# Patient Record
Sex: Male | Born: 1980 | Race: Black or African American | Hispanic: No | Marital: Single | Smoking: Current every day smoker
Health system: Southern US, Community
[De-identification: ages and names within clinical notes are randomized; demographics above are authoritative.]

## PROBLEM LIST (undated history)

## (undated) DIAGNOSIS — G43909 Migraine, unspecified, not intractable, without status migrainosus: Secondary | ICD-10-CM

## (undated) DIAGNOSIS — J45909 Unspecified asthma, uncomplicated: Secondary | ICD-10-CM

## (undated) DIAGNOSIS — F1010 Alcohol abuse, uncomplicated: Secondary | ICD-10-CM

## (undated) DIAGNOSIS — S82899A Other fracture of unspecified lower leg, initial encounter for closed fracture: Secondary | ICD-10-CM

## (undated) HISTORY — PX: NO PAST SURGERIES: SHX2092

---

## 2010-12-24 ENCOUNTER — Emergency Department (HOSPITAL_COMMUNITY): Payer: Self-pay

## 2010-12-24 ENCOUNTER — Emergency Department (HOSPITAL_COMMUNITY)
Admission: EM | Admit: 2010-12-24 | Discharge: 2010-12-24 | Disposition: A | Discharge status: Home / Self Care | Payer: Self-pay | Attending: Emergency Medicine | Admitting: Emergency Medicine

## 2010-12-24 DIAGNOSIS — S62339A Displaced fracture of neck of unspecified metacarpal bone, initial encounter for closed fracture: Secondary | ICD-10-CM | POA: Insufficient documentation

## 2010-12-24 DIAGNOSIS — M79609 Pain in unspecified limb: Secondary | ICD-10-CM | POA: Insufficient documentation

## 2011-08-28 ENCOUNTER — Emergency Department: Payer: Self-pay | Admitting: Emergency Medicine

## 2012-07-30 ENCOUNTER — Emergency Department (HOSPITAL_COMMUNITY)
Admission: EM | Admit: 2012-07-30 | Discharge: 2012-07-30 | Disposition: A | Discharge status: Home / Self Care | Payer: Self-pay | Attending: Emergency Medicine | Admitting: Emergency Medicine

## 2012-07-30 ENCOUNTER — Emergency Department (HOSPITAL_COMMUNITY): Payer: Self-pay

## 2012-07-30 ENCOUNTER — Encounter (HOSPITAL_COMMUNITY): Payer: Self-pay | Admitting: *Deleted

## 2012-07-30 DIAGNOSIS — F17200 Nicotine dependence, unspecified, uncomplicated: Secondary | ICD-10-CM | POA: Insufficient documentation

## 2012-07-30 DIAGNOSIS — S058X9A Other injuries of unspecified eye and orbit, initial encounter: Secondary | ICD-10-CM | POA: Insufficient documentation

## 2012-07-30 DIAGNOSIS — S01119A Laceration without foreign body of unspecified eyelid and periocular area, initial encounter: Secondary | ICD-10-CM

## 2012-07-30 MED ORDER — BACITRACIN 500 UNIT/GM EX OINT: 1.0000 "application " | TOPICAL_OINTMENT | Freq: Four times a day (QID) | CUTANEOUS | Status: DC

## 2012-07-30 MED ORDER — OXYCODONE-ACETAMINOPHEN 5-325 MG PO TABS
2.0000 | ORAL_TABLET | Freq: Once | ORAL | Status: AC
  Administered 2012-07-30: 2 via ORAL
  Filled 2012-07-30: qty 2

## 2012-07-30 MED ORDER — FENTANYL CITRATE 0.05 MG/ML IJ SOLN: 50.0000 ug | Freq: Once | INTRAMUSCULAR | Status: DC

## 2012-07-30 MED ORDER — OXYCODONE-ACETAMINOPHEN 5-325 MG PO TABS: 1.0000 | ORAL_TABLET | Freq: Four times a day (QID) | ORAL | Status: DC | PRN
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
## 2012-07-30 NOTE — ED Provider Notes (Signed)
  Physical Exam  BP 129/84  Pulse 89  Temp 97.6 F (36.4 C) (Oral)  Resp 16  Ht 6' (1.829 m)  Wt 194 lb (87.998 kg)  BMI 26.31 kg/m2  SpO2 97%  Physical Exam Dr. Allyne Gee at bedside suturing lid  ED Course  Procedures  MDM       Arman Filter, NP 08/01/12 [phone]

## 2012-08-04 NOTE — ED Provider Notes (Signed)
Medical screening examination/treatment/procedure(s) were conducted as a shared visit with non-physician practitioner(s) and myself.  I personally evaluated the patient during the encounter  Francisco Jacobs. Rubin Payor, MD 08/04/12 2300

## 2012-12-14 ENCOUNTER — Encounter (HOSPITAL_COMMUNITY): Payer: Self-pay | Admitting: *Deleted

## 2012-12-14 ENCOUNTER — Emergency Department (HOSPITAL_COMMUNITY)
Admission: EM | Admit: 2012-12-14 | Discharge: 2012-12-14 | Disposition: A | Discharge status: Home / Self Care | Payer: Self-pay | Attending: Emergency Medicine | Admitting: Emergency Medicine

## 2012-12-14 DIAGNOSIS — F17200 Nicotine dependence, unspecified, uncomplicated: Secondary | ICD-10-CM | POA: Insufficient documentation

## 2012-12-14 DIAGNOSIS — Y929 Unspecified place or not applicable: Secondary | ICD-10-CM | POA: Insufficient documentation

## 2012-12-14 DIAGNOSIS — Y9389 Activity, other specified: Secondary | ICD-10-CM | POA: Insufficient documentation

## 2012-12-14 DIAGNOSIS — S61011A Laceration without foreign body of right thumb without damage to nail, initial encounter: Secondary | ICD-10-CM

## 2012-12-14 DIAGNOSIS — W269XXA Contact with unspecified sharp object(s), initial encounter: Secondary | ICD-10-CM | POA: Insufficient documentation

## 2012-12-14 DIAGNOSIS — S61209A Unspecified open wound of unspecified finger without damage to nail, initial encounter: Secondary | ICD-10-CM | POA: Insufficient documentation
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
## 2012-12-14 NOTE — ED Notes (Signed)
Pt d/c with [employer]
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
## 2012-12-15 NOTE — ED Provider Notes (Signed)
  Medical screening examination/treatment/procedure(s) were performed by non-physician practitioner and as supervising physician I was immediately available for consultation/collaboration.    Gerhard Munch, MD 12/15/12 2326

## 2013-06-30 ENCOUNTER — Encounter (HOSPITAL_COMMUNITY): Payer: Self-pay | Admitting: Emergency Medicine

## 2013-06-30 ENCOUNTER — Emergency Department (HOSPITAL_COMMUNITY)
Admission: EM | Admit: 2013-06-30 | Discharge: 2013-06-30 | Disposition: A | Discharge status: Home / Self Care | Payer: Self-pay | Attending: Emergency Medicine | Admitting: Emergency Medicine

## 2013-06-30 DIAGNOSIS — S01501A Unspecified open wound of lip, initial encounter: Secondary | ICD-10-CM | POA: Insufficient documentation

## 2013-06-30 DIAGNOSIS — F17200 Nicotine dependence, unspecified, uncomplicated: Secondary | ICD-10-CM | POA: Insufficient documentation

## 2013-06-30 DIAGNOSIS — Y939 Activity, unspecified: Secondary | ICD-10-CM | POA: Insufficient documentation

## 2013-06-30 DIAGNOSIS — Y9289 Other specified places as the place of occurrence of the external cause: Secondary | ICD-10-CM | POA: Insufficient documentation

## 2013-06-30 DIAGNOSIS — W010XXA Fall on same level from slipping, tripping and stumbling without subsequent striking against object, initial encounter: Secondary | ICD-10-CM | POA: Insufficient documentation

## 2013-06-30 DIAGNOSIS — IMO0002 Reserved for concepts with insufficient information to code with codable children: Secondary | ICD-10-CM

## 2013-06-30 MED ORDER — CEPHALEXIN 500 MG PO CAPS: 500.0000 mg | ORAL_CAPSULE | Freq: Four times a day (QID) | ORAL | Status: DC

## 2013-06-30 MED ORDER — HYDROCODONE-ACETAMINOPHEN 5-325 MG PO TABS: 1.0000 | ORAL_TABLET | ORAL | Status: DC | PRN
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
## 2014-01-07 ENCOUNTER — Observation Stay (HOSPITAL_COMMUNITY)
Admission: AD | Admit: 2014-01-07 | Discharge: 2014-01-08 | Disposition: A | Discharge status: Home / Self Care | Payer: PRIVATE HEALTH INSURANCE | Source: Intra-hospital | Attending: Psychiatry | Admitting: Psychiatry

## 2014-01-07 ENCOUNTER — Encounter (HOSPITAL_COMMUNITY): Payer: Self-pay | Admitting: Emergency Medicine

## 2014-01-07 ENCOUNTER — Emergency Department (HOSPITAL_COMMUNITY)
Admission: EM | Admit: 2014-01-07 | Discharge: 2014-01-07 | Disposition: A | Discharge status: Other Acute Inpatient Hospital | Payer: Self-pay | Attending: Emergency Medicine | Admitting: Emergency Medicine

## 2014-01-07 DIAGNOSIS — Z79899 Other long term (current) drug therapy: Secondary | ICD-10-CM | POA: Insufficient documentation

## 2014-01-07 DIAGNOSIS — F332 Major depressive disorder, recurrent severe without psychotic features: Secondary | ICD-10-CM | POA: Insufficient documentation

## 2014-01-07 DIAGNOSIS — F1994 Other psychoactive substance use, unspecified with psychoactive substance-induced mood disorder: Secondary | ICD-10-CM | POA: Insufficient documentation

## 2014-01-07 DIAGNOSIS — F17200 Nicotine dependence, unspecified, uncomplicated: Secondary | ICD-10-CM | POA: Insufficient documentation

## 2014-01-07 DIAGNOSIS — R45850 Homicidal ideations: Secondary | ICD-10-CM | POA: Insufficient documentation

## 2014-01-07 DIAGNOSIS — F1010 Alcohol abuse, uncomplicated: Principal | ICD-10-CM | POA: Insufficient documentation

## 2014-01-07 DIAGNOSIS — F10929 Alcohol use, unspecified with intoxication, unspecified: Secondary | ICD-10-CM

## 2014-01-07 DIAGNOSIS — F1020 Alcohol dependence, uncomplicated: Secondary | ICD-10-CM | POA: Diagnosis present

## 2014-01-07 LAB — COMPREHENSIVE METABOLIC PANEL
ALK PHOS: 86 U/L (ref 39–117)
ALT: 180 U/L — AB (ref 0–53)
AST: 182 U/L — ABNORMAL HIGH (ref 0–37)
Albumin: 3.9 g/dL (ref 3.5–5.2)
BILIRUBIN TOTAL: 0.2 mg/dL — AB (ref 0.3–1.2)
BUN: 7 mg/dL (ref 6–23)
CHLORIDE: 103 meq/L (ref 96–112)
CO2: 21 meq/L (ref 19–32)
Calcium: 9.5 mg/dL (ref 8.4–10.5)
Creatinine, Ser: 0.66 mg/dL (ref 0.50–1.35)
GFR calc non Af Amer: >90 mL/min (ref >90)
GLUCOSE: 98 mg/dL (ref 70–99)
POTASSIUM: 4 meq/L (ref 3.7–5.3)
SODIUM: 141 meq/L (ref 137–147)
TOTAL PROTEIN: 8.2 g/dL (ref 6.0–8.3)

## 2014-01-07 LAB — ACETAMINOPHEN LEVEL

## 2014-01-07 LAB — RAPID URINE DRUG SCREEN, HOSP PERFORMED
Amphetamines: NOT DETECTED
BARBITURATES: NOT DETECTED
Benzodiazepines: NOT DETECTED
COCAINE: NOT DETECTED
Opiates: NOT DETECTED
TETRAHYDROCANNABINOL: NOT DETECTED

## 2014-01-07 LAB — CBC
HEMATOCRIT: 41.1 % (ref 39.0–52.0)
HEMOGLOBIN: 14.1 g/dL (ref 13.0–17.0)
MCH: 33.1 pg (ref 26.0–34.0)
MCHC: 34.3 g/dL (ref 30.0–36.0)
MCV: 96.5 fL (ref 78.0–100.0)
Platelets: 177 10*3/uL (ref 150–400)
RBC: 4.26 MIL/uL (ref 4.22–5.81)
RDW: 13.1 % (ref 11.5–15.5)
WBC: 4.5 10*3/uL (ref 4.0–10.5)

## 2014-01-07 LAB — SALICYLATE LEVEL: Salicylate Lvl: <2 mg/dL — ABNORMAL LOW (ref 2.8–20.0)

## 2014-01-07 LAB — ETHANOL: Alcohol, Ethyl (B): 163 mg/dL — ABNORMAL HIGH (ref 0–11)

## 2014-01-07 MED ORDER — LORAZEPAM 2 MG/ML IJ SOLN: 0.0000 mg | Freq: Four times a day (QID) | INTRAMUSCULAR | Status: DC

## 2014-01-07 MED ORDER — THIAMINE HCL 100 MG/ML IJ SOLN: 100.0000 mg | Freq: Every day | INTRAMUSCULAR | Status: DC

## 2014-01-07 MED ORDER — LORAZEPAM 1 MG PO TABS
0.0000 mg | ORAL_TABLET | Freq: Four times a day (QID) | ORAL | Status: DC
Start: 2014-01-07 — End: 2014-01-07

## 2014-01-07 MED ORDER — LORAZEPAM 1 MG PO TABS: 0.0000 mg | ORAL_TABLET | Freq: Two times a day (BID) | ORAL | Status: DC

## 2014-01-07 MED ORDER — LORAZEPAM 2 MG/ML IJ SOLN: 0.0000 mg | Freq: Two times a day (BID) | INTRAMUSCULAR | Status: DC

## 2014-01-07 MED ORDER — ACETAMINOPHEN 325 MG PO TABS
650.0000 mg | ORAL_TABLET | Freq: Once | ORAL | Status: AC
  Administered 2014-01-07: 650 mg via ORAL
  Filled 2014-01-07: qty 2

## 2014-01-07 MED ORDER — VITAMIN B-1 100 MG PO TABS
100.0000 mg | ORAL_TABLET | Freq: Every day | ORAL | Status: DC
  Administered 2014-01-07: 100 mg via ORAL
  Filled 2014-01-07: qty 1

## 2014-01-07 NOTE — ED Provider Notes (Signed)
Medical screening examination/treatment/procedure(s) were performed by non-physician practitioner and as supervising physician I was immediately available for consultation/collaboration.   EKG Interpretation None       Duayne Brideau K Linker, MD 01/07/14 2038 

## 2014-01-07 NOTE — ED Notes (Signed)
[facility] called.

## 2014-01-07 NOTE — ED Notes (Signed)
Patient speaking with [facility]

## 2014-01-07 NOTE — ED Provider Notes (Signed)
CSN: [id number]     Arrival date & time 01/07/14  1813 History   First MD Initiated Contact with Patient 01/07/14 1826     Chief Complaint  Patient presents with  . Alcohol Problem  . Homicidal     (Consider location/radiation/quality/duration/timing/severity/associated sxs/prior Treatment) HPI Comments: Patient is a 33 year old otherwise healthy male who presents today for detox from alcohol. He states that he has been drinking heavily over the past year. He drinks approximately 18-24 beers daily. He is periods of blackout due to alcohol. He has never had any seizure from alcohol withdrawal. He states he drank approximately 12 beers today and his last drink was around 4 PM. He denies any other drug use. He gives a history of remote cocaine and marijuana use. He states that his parole officer encouraged him to come to the emergency department for detox. He states that he thinks about hurting the people around him. He states "I get violent when I'm drunk". He states "I don't know what I would do". He denies any suicidal ideations. No visual or auditory hallucinations.  The history is provided by the patient. No language interpreter was used.    History reviewed. No pertinent past medical history. History reviewed. No pertinent past surgical history. No family history on file. History  Substance Use Topics  . Smoking status: Current Every Day Smoker -- 0.50 packs/day    Types: Cigarettes  . Smokeless tobacco: Current User  . Alcohol Use: Yes     Comment: reports heavy drinking x year    Review of Systems  Constitutional: Negative for fever and chills.  Respiratory: Negative for shortness of breath.   Cardiovascular: Negative for chest pain.  Gastrointestinal: Negative for nausea, vomiting and abdominal pain.  Psychiatric/Behavioral: Positive for behavioral problems, dysphoric mood and agitation. Negative for suicidal ideas and hallucinations.  All other systems reviewed and are  negative.     Allergies  Review of patient's allergies indicates no known allergies.  Home Medications   Prior to Admission medications   Medication Sig Start Date End Date Taking? Authorizing Provider  cephALEXin (KEFLEX) 500 MG capsule Take 1 capsule (500 mg total) by mouth 4 (four) times daily. 06/30/13   Kristen N Ward, DO  HYDROcodone-acetaminophen (NORCO/VICODIN) 5-325 MG per tablet Take 1 tablet by mouth every 4 (four) hours as needed. 06/30/13   Kristen N Ward, DO   BP 157/78  Pulse 96  Temp(Src) 97.9 F (36.6 C) (Oral)  Resp 18  Ht 6' (1.829 m)  Wt 215 lb (97.523 kg)  BMI 29.15 kg/m2  SpO2 97% Physical Exam  Nursing note and vitals reviewed. Constitutional: He is oriented to person, place, and time. He appears well-developed and well-nourished. No distress.  HENT:  Head: Normocephalic and atraumatic.  Right Ear: External ear normal.  Left Ear: External ear normal.  Nose: Nose normal.  Eyes: Conjunctivae are normal.  Neck: Normal range of motion. No tracheal deviation present.  Cardiovascular: Normal rate, regular rhythm and normal heart sounds.   Pulmonary/Chest: Effort normal and breath sounds normal. No stridor.  Abdominal: Soft. He exhibits no distension. There is no tenderness.  Musculoskeletal: Normal range of motion.  Neurological: He is alert and oriented to person, place, and time.  Skin: Skin is warm and dry. He is not diaphoretic.  Psychiatric: He has a normal mood and affect. His behavior is normal. He expresses impulsivity. He expresses homicidal (vague) ideation. He expresses no suicidal ideation. He expresses no suicidal plans and no homicidal  plans.    ED Course  Procedures (including critical care time) Labs Review Labs Reviewed  COMPREHENSIVE METABOLIC PANEL - Abnormal; Notable for the following:    AST 182 (*)    ALT 180 (*)    Total Bilirubin 0.2 (*)    All other components within normal limits  ETHANOL - Abnormal; Notable for the  following:    Alcohol, Ethyl (B) 163 (*)    All other components within normal limits  SALICYLATE LEVEL - Abnormal; Notable for the following:    Salicylate Lvl <2.0 (*)    All other components within normal limits  ACETAMINOPHEN LEVEL  CBC  URINE RAPID DRUG SCREEN (HOSP PERFORMED)    Imaging Review No results found.   EKG Interpretation None      MDM   Final diagnoses:  Alcohol intoxication  Alcohol abuse    Patient presents to the emergency department with alcohol intoxication and is requesting detox from alcohol. 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 He has a vague homicidal ideations. He states that when he gets strong she gets violent and cannot control himself. Patient is currently asymptomatic. Patient is medically cleared. Patient is currently undergoing TTS evaluation.   Mora BellmanHannah S Whitnee Orzel, PA-C 01/07/14 2035

## 2014-01-07 NOTE — ED Provider Notes (Signed)
Medical screening examination/treatment/procedure(s) were performed by non-physician practitioner and as supervising physician I was immediately available for consultation/collaboration.   EKG Interpretation None       Ethelda Chick, MD 01/07/14 2100

## 2014-01-07 NOTE — BH Assessment (Signed)
Clinician consulted with Alberteen Sam NP who states Pt meets criteria for inpatient admission to Observation unit until a bed becomes available on the unit. Clint Bolder, [facility] reported availability on Observation unit at this time. Clinician provided updates to Claris Gladden, NP.    Yaakov Guthrie, MSW, LCSW Triage Specialist [phone]

## 2014-01-07 NOTE — ED Notes (Signed)
Pt here for detox from ETOH and cocaine. States he hasn't used cocaine in about a week. Reports drinking a 12 pack of beer today. States he has been drinking heavily for appx 1 year. Pt also expressing homicidal thoughts stating "I just think about hurting everyone around me." When asked if he wants to hurt himself pt states "I don't know, I am just numb." Pt is calm, cooperative. Denies auditory/visual hallucinations.

## 2014-01-07 NOTE — BH Assessment (Signed)
Tele Assessment Note   Francisco MediciMarcus Jacobs is an 33 y.o. male who [facility] alcohol detox. Pt reported his probation officer suggested that he seek alcohol detox.  Pt reported drinking alcohol daily for the past year. Pt reported hx of cocaine use but stated last use was 7-8 months ago. Pt reported that once he stopped using cocaine, his alcohol use increased greatly. Pt denied experiencing withdrawal symptoms but stated he was irritable and had a headache. Pt denies inpatient SA hx. Pt stated he is currently involved in [facility] program for the last 3 months for substance use.   Pt denies SI, HI, and AVH. Pt Ox4. Pt reported anxious and irritable mood with anxious affect.  Pt reported he is currently homeless and his family supports live in [place]. 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 Pt indicated his current stressors are due to him dealing with 2 different women that he got pregnant, which increases his urge to drink.  Axis I: Alcohol Use Disorder, severe Axis II: Deferred Axis III: History reviewed. No pertinent past medical history. Axis IV: housing problems, other psychosocial or environmental problems and problems related to legal system/crime  Past Medical History: History reviewed. No pertinent past medical history.  History reviewed. No pertinent past surgical history.  Family History: No family history on file.  Social History:  reports that he has been smoking Cigarettes.  He has been smoking about 0.50 packs per day. He uses smokeless tobacco. He reports that he drinks alcohol. He reports that he uses illicit drugs (Cocaine) about 7 times per week.  Additional Social History:  Alcohol / Drug Use Pain Medications: none reported Prescriptions: none reported Over the Counter: none reported History of alcohol / drug use?: Yes Longest period of sobriety (when/how long): unk Negative Consequences of Use: Personal relationships Withdrawal Symptoms:  Agitation;Irritability Substance #1 Name of Substance 1: ETOH 1 - Age of First Use: 26 1 - Amount (size/oz): 12-18 pack of beer 1 - Frequency: daily 1 - Duration: year 1 - Last Use / Amount: 01/07/14 Substance #2 Name of Substance 2: cocaine 2 - Age of First Use: 21 2 - Amount (size/oz): $50-100 worth 2 - Frequency: was using daily- not currently 2 - Duration: ongoing 2 - Last Use / Amount: 7-8 months ago  CIWA: CIWA-Ar BP: 157/78 mmHg Pulse Rate: 96 Nausea and Vomiting: no nausea and no vomiting Tactile Disturbances: none Tremor: no tremor Auditory Disturbances: not present Paroxysmal Sweats: no sweat visible Visual Disturbances: not present Anxiety: two Headache, Fullness in Head: mild Agitation: normal activity Orientation and Clouding of Sensorium: oriented and can do serial additions CIWA-Ar Total: 4 COWS:    Allergies: No Known Allergies  Home Medications:  (Not in a hospital admission)  OB/GYN Status:  No LMP for male patient.  General Assessment Data [facility] Is this a Tele or Face-to-Face Assessment?: Tele Assessment Is this an Initial Assessment or a Re-assessment for this encounter?: Initial Assessment Living Arrangements: Other (Comment) (Pt reported currently being homeless.) Can pt return to current living arrangement?: No Admission Status: Voluntary Is patient capable of signing voluntary admission?: Yes Transfer from: Acute Hospital Referral Source: Self/Family/Friend  Medical Screening Exam [facility] Walk-in ONLY) Medical Exam completed:  (NA)  [facility] Living Arrangements: Other (Comment) (Pt reported currently being homeless.) Name of Psychiatrist:  (NA) Name of Therapist:  (NA)     Risk to self Suicidal Ideation: No Suicidal Intent: No Is patient at risk  for suicide?: No Suicidal Plan?: No Access to Means: No What has been your use of drugs/alcohol within the last 12 months?:  (etoh and cocaine) Previous  Attempts/Gestures: No How many times?: 0 Other Self Harm Risks:  (none) Triggers for Past Attempts: None known Intentional Self Injurious Behavior: None Family Suicide History: Unknown Recent stressful life event(s): Conflict (Comment) (Pt reported conflict with 2 women who he has gotten pregnant) Persecutory voices/beliefs?: No Depression: No Depression Symptoms: Insomnia;Feeling angry/irritable Substance abuse history and/or treatment for substance abuse?: No Suicide prevention information given to non-admitted patients: Not applicable  Risk to Others Homicidal Ideation: No Thoughts of Harm to Others: No Current Homicidal Intent: No Current Homicidal Plan: No Access to Homicidal Means: No Identified Victim:  (NA) History of harm to others?: Yes Assessment of Violence: In distant past Violent Behavior Description:  (Pt was calm and cooperative during assessment. ) Does patient have access to weapons?: No Criminal Charges Pending?: Yes Describe Pending Criminal Charges:  (Driving with open container. ) Does patient have a court date: Yes Court Date:  (03/01/14)  Psychosis Hallucinations: None noted Delusions: None noted  Mental Status Report Appear/Hygiene: In scrubs Eye Contact: Good Motor Activity: Unremarkable Speech: Logical/coherent Level of Consciousness: Alert Mood: Anxious;Irritable Affect: Anxious Anxiety Level: Minimal Thought Processes: Coherent;Relevant Judgement: Unimpaired Orientation: Person;Place;Time;Situation Obsessive Compulsive Thoughts/Behaviors: None  Cognitive Functioning Concentration: Normal Memory: Recent Intact;Remote Intact IQ: Average Insight: Fair Impulse Control: Poor Appetite: Fair Weight Loss:  (unk) Weight Gain:  (unk) Sleep: Decreased Total Hours of Sleep: 5 Vegetative Symptoms: None  ADLScreening [facility] Assessment Services) Patient's cognitive ability adequate to safely complete daily activities?: Yes Patient able to  express need for assistance with ADLs?: Yes Independently performs ADLs?: Yes (appropriate for developmental age)  Prior Inpatient Therapy Prior Inpatient Therapy: No Prior Therapy Dates:  (NA) Prior Therapy Facilty/Provider(s):  (NA) Reason for Treatment:  (NA)  Prior Outpatient Therapy Prior Outpatient Therapy: Yes Prior Therapy Dates:  (currently- for last 3 months) Prior Therapy Facilty/Provider(s):  ([facility]) Reason for Treatment:  (alcohol and cocaine use)  ADL Screening (condition at time of admission) Patient's cognitive ability adequate to safely complete daily activities?: Yes Is the patient deaf or have difficulty hearing?: No Does the patient have difficulty seeing, even when wearing glasses/contacts?: No Does the patient have difficulty concentrating, remembering, or making decisions?: No Patient able to express need for assistance with ADLs?: Yes Does the patient have difficulty dressing or bathing?: No Independently performs ADLs?: Yes (appropriate for developmental age)       Abuse/Neglect Assessment (Assessment to be complete while patient is alone) Physical Abuse: Denies Verbal Abuse: Denies Sexual Abuse: Denies Exploitation of patient/patient's resources: Denies Self-Neglect: Denies Values / Beliefs Cultural Requests During Hospitalization: None Spiritual Requests During Hospitalization: None   Advance Directives (For Healthcare) Advance Directive: Patient does not have advance directive    Additional Information 1:1 In Past 12 Months?: No CIRT Risk: No Elopement Risk: No Does patient have medical clearance?: Yes    Disposition: Clinician consulted with Alberteen Sam NP who states Pt meets criteria for inpatient admission to Observation unit until a bed becomes available on the unit. Clint Bolder, [facility] reported availability on Observation unit at this time. Clinician provided updates to Claris Gladden, NP.   Disposition Initial Assessment Completed for this  Encounter: Yes  Alric Quan 01/07/2014 8:35 PM

## 2014-01-07 NOTE — ED Notes (Signed)
Patient unable to urinate at this time. 

## 2014-01-07 NOTE — ED Notes (Addendum)
Francisco Jacobs has arrived and is taking patient. Patient belongings and paper work given to [name].

## 2014-01-07 NOTE — ED Notes (Signed)
Francisco Saville, PA at the bedside.  

## 2014-01-07 NOTE — ED Notes (Signed)
Marvin Grabill, PA at the bedside.  

## 2014-01-07 NOTE — ED Provider Notes (Signed)
Patient has been accepted to the behavioral health observation unit where he will stay until morning and then be transferred to the psychiatric unit.  Dr. Lucianne Muss is accepting.  He is been evaluated by Alberteen Sam, NP  Arman Filter, NP 01/07/14 2056

## 2014-01-07 NOTE — BH Assessment (Signed)
Clinician contacted Dahlia Client, [place] to gather report prior to seeing pt. Dahlia Client reported that pt is seeking detox from alcohol and cocaine. Assessment to be initiated.   Yaakov Guthrie, MSW, LCSW Triage Specialist [phone]

## 2014-01-07 NOTE — ED Notes (Signed)
Called BH x1 to give report.

## 2014-01-08 ENCOUNTER — Encounter (HOSPITAL_COMMUNITY): Payer: Self-pay | Admitting: Emergency Medicine

## 2014-01-08 DIAGNOSIS — F1020 Alcohol dependence, uncomplicated: Secondary | ICD-10-CM | POA: Diagnosis present

## 2014-01-08 MED ORDER — HYDROXYZINE HCL 50 MG PO TABS
50.0000 mg | ORAL_TABLET | Freq: Every evening | ORAL | Status: DC | PRN
  Administered 2014-01-08: 50 mg via ORAL
  Filled 2014-01-08: qty 1

## 2014-01-08 MED ORDER — VITAMIN B-1 100 MG PO TABS
100.0000 mg | ORAL_TABLET | Freq: Every day | ORAL | Status: DC
  Administered 2014-01-08: 100 mg via ORAL
  Filled 2014-01-08 (×3): qty 1

## 2014-01-08 MED ORDER — LORAZEPAM 2 MG/ML IJ SOLN: 1.0000 mg | Freq: Four times a day (QID) | INTRAMUSCULAR | Status: DC | PRN

## 2014-01-08 MED ORDER — FOLIC ACID 1 MG PO TABS
1.0000 mg | ORAL_TABLET | Freq: Every day | ORAL | Status: DC
Start: 2014-01-08 — End: 2014-01-08
  Administered 2014-01-08: 1 mg via ORAL
  Filled 2014-01-08 (×3): qty 1

## 2014-01-08 MED ORDER — LORAZEPAM 1 MG PO TABS: 0.0000 mg | ORAL_TABLET | Freq: Four times a day (QID) | ORAL | Status: DC

## 2014-01-08 MED ORDER — ACETAMINOPHEN 325 MG PO TABS
650.0000 mg | ORAL_TABLET | Freq: Four times a day (QID) | ORAL | Status: DC | PRN
  Administered 2014-01-08: 650 mg via ORAL
  Filled 2014-01-08: qty 2

## 2014-01-08 MED ORDER — LORAZEPAM 1 MG PO TABS: 0.0000 mg | ORAL_TABLET | Freq: Two times a day (BID) | ORAL | Status: DC

## 2014-01-08 MED ORDER — LORAZEPAM 1 MG PO TABS
1.0000 mg | ORAL_TABLET | Freq: Four times a day (QID) | ORAL | Status: DC | PRN
  Administered 2014-01-08: 1 mg via ORAL
  Filled 2014-01-08: qty 1

## 2014-01-08 MED ORDER — ADULT MULTIVITAMIN W/MINERALS CH
1.0000 | ORAL_TABLET | Freq: Every day | ORAL | Status: DC
  Administered 2014-01-08: 1 via ORAL
  Filled 2014-01-08 (×3): qty 1

## 2014-01-08 MED ORDER — THIAMINE HCL 100 MG/ML IJ SOLN: 100.0000 mg | Freq: Every day | INTRAMUSCULAR | Status: DC

## 2014-01-08 NOTE — Discharge Summary (Signed)
Patient denies any suicidal ideation, homicidal ideation, his CIWA score is 0. Patient can be discharged home with resources.

## 2014-01-08 NOTE — Progress Notes (Signed)
Patient ID: Francisco Jacobs, male   DOB: September 26, 1980, 33 y.o.   MRN: [id number] Has slept most of the shift. He complained of a headache earlier in the shift and was given Tylenol with relief. He has had no other complaints of withdrawal sx and has not received any Ativan for his detox. He is pleasant and cooperative. He showered this am, and has had a good appetite. He has made mulitiple phone calls in anticipation of being released from OBS. [name] NP evaluated and recommended he be discharge and to be referred to rehab centers. No beds available today. Referral information sent to [facility] but Melissa at [facility] said there would be no beds before Wed.

## 2014-01-08 NOTE — Plan of Care (Signed)
[facility] Observation Crisis Plan  Reason for Crisis Plan:  Substance Abuse   Plan of Care:  Referral for Inpatient Hospitalization  Family Support:      Current Living Environment:  Living Arrangements: Alone  Insurance:   Hospital Account   Name Acct ID Class Status Primary Coverage   Idan, Nazari [id number] BEHAVIORAL HEALTH OBSERVATION Open None        Guarantor Account (for Hospital Account [id number])   Name Relation to Pt Service Area Active? Acct Type   Boyce Medici Self [facility] Yes Behavioral Health   Address Phone       [address] [place], [place] [address] [phone](H)          Coverage Information (for Hospital Account [id number])   Not on file      Legal Guardian:   self  Primary Care Provider:  No PCP Per Patient  Current Outpatient Providers:  [facility]  Psychiatrist:   none  Counselor/Therapist:   none  Compliant with Medications:  Yes  Additional Information:   Winfield Cunas 6/8/20156:23 AM

## 2014-01-08 NOTE — H&P (Signed)
Patient is admitted to [facility] Observation unit for detox.

## 2014-01-08 NOTE — Discharge Instructions (Signed)
To contact daily [phone] [facility] [phone] [phone] These three agencies provide residential rehab so call these three until you are accepted for a bed

## 2014-01-08 NOTE — [facility] Suicide Risk Assessment (Cosign Needed)
Suicide Risk Assessment  Discharge Assessment     Nursing information obtained from:  Patient Demographic factors:  Male;Low socioeconomic status;Living alone;Unemployed Current Mental Status:  NA Loss Factors:  Legal issues;Financial problems / change in socioeconomic status Historical Factors:  Family history of mental illness or substance abuse Risk Reduction Factors:  Sense of responsibility to family Total Time spent with patient: 40 minutes  CLINICAL FACTORS:   Severe Anxiety and/or Agitation Depression:   Anhedonia Comorbid alcohol abuse/dependence Hopelessness Impulsivity Alcohol/Substance Abuse/Dependencies  Psychiatric Specialty Exam:     Blood pressure 137/91, pulse 81, temperature 97.6 F (36.4 C), temperature source Oral, resp. rate 18, height 5\' 11"  (1.803 m), weight 97.07 kg (214 lb).Body mass index is 29.86 kg/(m^2).   General Appearance: Fairly Groomed  Patent attorney::  Good  Speech:  Clear and Coherent  Volume:  Normal  Mood:  Anxious and Depressed  Affect:  Appropriate and Congruent  Thought Process:  Coherent and Goal Directed  Orientation:  Full (Time, Place, and Person)  Thought Content:  WDL  Suicidal Thoughts:  No  Homicidal Thoughts:  No  Memory:  Immediate;   Fair Recent;   Fair Remote;   Fair  Judgement:  Fair  Insight:  Good  Psychomotor Activity:  Normal  Concentration:  Fair  Recall:  [employer] of Knowledge:Good  Language: Fair  Akathisia:  No  Handed:    AIMS (if indicated):     Assets:  Communication Skills Desire for Improvement Resilience  Sleep:       Musculoskeletal: Strength & Muscle Tone: within normal limits Gait & Station: normal Patient leans: N/A  COGNITIVE FEATURES THAT CONTRIBUTE TO RISK:  Polarized thinking    SUICIDE RISK:   Minimal: No identifiable suicidal ideation.  Patients presenting with no risk factors but with morbid ruminations; may be classified as minimal risk based on the severity of the  depressive symptoms  PLAN OF CARE: -Discharge home with outpatient resources for followup for alcohol detoxification.   Beau Fanny, FNP-BC 01/08/2014, 5:14 PM

## 2014-01-08 NOTE — Progress Notes (Signed)
Patient ID: Francisco Jacobs, male   DOB: Oct 14, 1980, 33 y.o.   MRN: [id number]  Patient is a 33 yo male admitted voluntarily to [facility] for alcohol detox. Pt states he is not actually suicidal or homicidal, but came for treatment on the suggestion of his parole officer who advised him to show that he sought help. Pt with pending court dates. Pt states he drinks an average of 18 beers/day and is under a lot of stress. Pt states he currently has two babies on the way by two different women. Pt homeless and is "in between places". 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 Pt pleasant and cooperative with care. Q 15 minute safety checks initiated per protocol. No distress noted.

## 2014-01-08 NOTE — Progress Notes (Signed)
Patient ID: Francisco Jacobs, male   DOB: [birth year], 33 y.o.   MRN: [id number] Discharge Note-Unable to get him directly into rehab from his stay in OBS. No beds available at [facility] or [facility] both with waiting lists. [facility] never called back. Reviewed with him discharge plan to call those three agencies every day until accepted in. No medications so no Rx's on discharge. 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 He verbalized motivation to get clean and sober. He is on two lists for housing. 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 Given a bus ticket on discharge.He denies any thoughts to hurt self or others. No psychotic symptoms.All property returned. No complaints voiced and expressed understanding of his follow up.

## 2014-01-08 NOTE — H&P (Signed)
[facility] OBS UNIT H&P  Patient Identification:  Francisco Jacobs Date of Evaluation:  01/08/2014 Chief Complaint:  ETOH USE DISORDER,SEV  Subjective: Pt seen and chart reviewed. Pt denies SI, HI, and AVH, contracts for safety. Pt reports. That he has been consuming approximately 18-24 beers per day for the past year. Pt reports that his main stressor is that he recently got 2 women pregnant and that he "blacks out often and can't remember what happened". Pt reports that he has supposedly gotten into fights with friends but has no memory of it. Pt cites a good support system of friends but that all of his family is in [place] so there is "no one great here" to support him. Pt is open to seeking treatment at [facility] and other equivalent facilities. Pt given resources. Pt's current CIWA score is 0 and has been consistently low.   History of Present Illness:: Francisco Jacobs is an 33 y.o. male who presents voluntarily to [facility] seeking alcohol detox. Pt reported his probation officer suggested that he seek alcohol detox. Pt reported drinking alcohol daily for the past year. Pt reported hx of cocaine use but stated last use was 7-8 months ago. Pt reported that once he stopped using cocaine, his alcohol use increased greatly. Pt denied experiencing withdrawal symptoms but stated he was irritable and had a headache. Pt denies inpatient SA hx. Pt stated he is currently involved in [facility] for the last 3 months for substance use. Pt denies SI, HI, and AVH. Pt Ox4. Pt reported anxious and irritable mood with anxious affect. Pt reported he is currently homeless and his family supports live in [place]. 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 Pt indicated his current stressors are due to him dealing with 2 different women that he got pregnant, which increases his urge to drink.    Total Time spent with patient: 40 minutes  Psychiatric Specialty Exam: Physical Exam Full Physical Exam performed in ED; reviewed, stable, and I concur with  this assessment.   Review of Systems  Constitutional: Negative.   HENT: Negative.   Eyes: Negative.   Respiratory: Negative.   Cardiovascular: Negative.   Gastrointestinal: Negative.   Genitourinary: Negative.   Musculoskeletal: Negative.   Skin: Negative.   Neurological: Negative.   Endo/Heme/Allergies: Negative.   Psychiatric/Behavioral: Positive for depression and substance abuse. The patient is nervous/anxious.     Blood pressure 137/91, pulse 81, temperature 97.6 F (36.4 C), temperature source Oral, resp. rate 18, height _0  (1.803 m), weight 97.07 kg (214 lb).Body mass index is 29.86 kg/(m^2).  General Appearance: Fairly Groomed  Engineer, water::  Good  Speech:  Clear and Coherent  Volume:  Normal  Mood:  Anxious and Depressed  Affect:  Appropriate and Congruent  Thought Process:  Coherent and Goal Directed  Orientation:  Full (Time, Place, and Person)  Thought Content:  WDL  Suicidal Thoughts:  No  Homicidal Thoughts:  No  Memory:  Immediate;   Fair Recent;   Fair Remote;   Fair  Judgement:  Fair  Insight:  Good  Psychomotor Activity:  Normal  Concentration:  Fair  Recall:  [employer] of Knowledge:Good  Language: Fair  Akathisia:  No  Handed:    AIMS (if indicated):     Assets:  Communication Skills Desire for Improvement Resilience  Sleep:       Musculoskeletal: Strength & Muscle Tone: within normal limits Gait & Station: normal Patient leans: N/A   Past Medical History:  History  reviewed. No pertinent past medical history. Loss of Consciousness:  Pt reports blackouts nearly every time he drinks. Allergies:  No Known Allergies PTA Medications: No prescriptions prior to admission    Previous Psychotropic Medications:  Medication/Dose  SEE MAR               Substance Abuse History in the last 12 months:  yes  Consequences of Substance Abuse: Medical Consequences:  Hospitalization  Social History:  reports that he has been smoking  Cigarettes.  He has been smoking about 0.50 packs per day. He has never used smokeless tobacco. He reports that he drinks alcohol. He reports that he uses illicit drugs (Cocaine) about 7 times per week. Additional Social History: History of alcohol / drug use?: Yes Negative Consequences of Use: Financial;Personal relationships;Legal Withdrawal Symptoms: Other (Comment) (none)                     Family History:  History reviewed. No pertinent family history.  Results for orders placed during the hospital encounter of 01/07/14 (from the past 72 hour(s))  ACETAMINOPHEN LEVEL     Status: None   Collection Time    01/07/14  6:29 PM      Result Value Ref Range   Acetaminophen (Tylenol), Serum <15.0  10 - 30 ug/mL   Comment:            THERAPEUTIC CONCENTRATIONS VARY     SIGNIFICANTLY. A RANGE OF 10-30     ug/mL MAY BE AN EFFECTIVE     CONCENTRATION FOR MANY PATIENTS.     HOWEVER, SOME ARE BEST TREATED     AT CONCENTRATIONS OUTSIDE THIS     RANGE.     ACETAMINOPHEN CONCENTRATIONS     >150 ug/mL AT 4 HOURS AFTER     INGESTION AND >50 ug/mL AT 12     HOURS AFTER INGESTION ARE     OFTEN ASSOCIATED WITH TOXIC     REACTIONS.  CBC     Status: None   Collection Time    01/07/14  6:29 PM      Result Value Ref Range   WBC 4.5  4.0 - 10.5 K/uL   RBC 4.26  4.22 - 5.81 MIL/uL   Hemoglobin 14.1  13.0 - 17.0 g/dL   HCT 41.1  39.0 - 52.0 %   MCV 96.5  78.0 - 100.0 fL   MCH 33.1  26.0 - 34.0 pg   MCHC 34.3  30.0 - 36.0 g/dL   RDW 13.1  11.5 - 15.5 %   Platelets 177  150 - 400 K/uL  COMPREHENSIVE METABOLIC PANEL     Status: Abnormal   Collection Time    01/07/14  6:29 PM      Result Value Ref Range   Sodium 141  137 - 147 mEq/L   Potassium 4.0  3.7 - 5.3 mEq/L   Chloride 103  96 - 112 mEq/L   CO2 21  19 - 32 mEq/L   Glucose, Bld 98  70 - 99 mg/dL   BUN 7  6 - 23 mg/dL   Creatinine, Ser 0.66  0.50 - 1.35 mg/dL   Calcium 9.5  8.4 - 10.5 mg/dL   Total Protein 8.2  6.0 - 8.3 g/dL    Albumin 3.9  3.5 - 5.2 g/dL   AST 182 (*) 0 - 37 U/L   ALT 180 (*) 0 - 53 U/L   Alkaline Phosphatase 86  39 - 117 U/L  Total Bilirubin 0.2 (*) 0.3 - 1.2 mg/dL   GFR calc non Af Amer >90  >90 mL/min   GFR calc Af Amer >90  >90 mL/min   Comment: (NOTE)     The eGFR has been calculated using the CKD EPI equation.     This calculation has not been validated in all clinical situations.     eGFR's persistently <90 mL/min signify possible Chronic Kidney     Disease.  ETHANOL     Status: Abnormal   Collection Time    01/07/14  6:29 PM      Result Value Ref Range   Alcohol, Ethyl (B) 163 (*) 0 - 11 mg/dL   Comment:            LOWEST DETECTABLE LIMIT FOR     SERUM ALCOHOL IS 11 mg/dL     FOR MEDICAL PURPOSES ONLY  SALICYLATE LEVEL     Status: Abnormal   Collection Time    01/07/14  6:29 PM      Result Value Ref Range   Salicylate Lvl <7.5 (*) 2.8 - 20.0 mg/dL  URINE RAPID DRUG SCREEN (HOSP PERFORMED)     Status: None   Collection Time    01/07/14  8:43 PM      Result Value Ref Range   Opiates NONE DETECTED  NONE DETECTED   Cocaine NONE DETECTED  NONE DETECTED   Benzodiazepines NONE DETECTED  NONE DETECTED   Amphetamines NONE DETECTED  NONE DETECTED   Tetrahydrocannabinol NONE DETECTED  NONE DETECTED   Barbiturates NONE DETECTED  NONE DETECTED   Comment:            DRUG SCREEN FOR MEDICAL PURPOSES     ONLY.  IF CONFIRMATION IS NEEDED     FOR ANY PURPOSE, NOTIFY LAB     WITHIN 5 DAYS.                LOWEST DETECTABLE LIMITS     FOR URINE DRUG SCREEN     Drug Class       Cutoff (ng/mL)     Amphetamine      1000     Barbiturate      200     Benzodiazepine   170     Tricyclics       017     Opiates          300     Cocaine          300     THC              50   Psychological Evaluations:  Assessment:   DSM5:  Substance/Addictive Disorders:  Alcohol Related Disorder - Severe (303.90) Depressive Disorders:  Major Depressive Disorder - Severe (296.23)  AXIS I:  Alcohol  Abuse, Major Depression, Recurrent severe and Substance Induced Mood Disorder AXIS II:  Deferred AXIS III:  History reviewed. No pertinent past medical history. AXIS IV:  other psychosocial or environmental problems and problems related to social environment AXIS V:  51-60 moderate symptoms  Treatment Plan/Recommendations:   Review of chart, vital signs, medications, and notes.  1-Medication management for depression and anxiety: Medications reviewed with the patient and she stated no untoward effects, unchanged. 2-Coping skills for depression, anxiety  3-Continue crisis stabilization and management  4-Address health issues--monitoring vital signs, stable  5-Treatment plan in progress to prevent relapse of depression and anxiety  Treatment Plan Summary: Daily contact with patient to assess and evaluate  symptoms and progress in treatment Medication management Current Medications:  Current Facility-Administered Medications  Medication Dose Route Frequency Provider Last Rate Last Dose  . folic acid (FOLVITE) tablet 1 mg  1 mg Oral Daily Lurena Nida, NP   1 mg at 01/08/14 2080  . hydrOXYzine (ATARAX/VISTARIL) tablet 50 mg  50 mg Oral QHS PRN Lurena Nida, NP   50 mg at 01/08/14 0105  . LORazepam (ATIVAN) tablet 1 mg  1 mg Oral Q6H PRN Lurena Nida, NP   1 mg at 01/08/14 0106   Or  . LORazepam (ATIVAN) injection 1 mg  1 mg Intravenous Q6H PRN Lurena Nida, NP      . LORazepam (ATIVAN) tablet 0-4 mg  0-4 mg Oral Q6H Lurena Nida, NP       Followed by  . [START ON 01/10/2014] LORazepam (ATIVAN) tablet 0-4 mg  0-4 mg Oral Q12H Lurena Nida, NP      . multivitamin with minerals tablet 1 tablet  1 tablet Oral Daily Lurena Nida, NP   1 tablet at 01/08/14 [phone]  . thiamine (VITAMIN B-1) tablet 100 mg  100 mg Oral Daily Lurena Nida, NP   100 mg at 01/08/14 6122   Or  . thiamine (B-1) injection 100 mg  100 mg Intramuscular Daily Lurena Nida, NP       Benjamine Mola, FNP-BC 6/8/20159:49  AM

## 2014-01-08 NOTE — Discharge Summary (Signed)
[facility] OBS UNIT DISCHARGE SUMMARY   *Pt was evaluated by this NP during this encounter in [facility] OBS Unit. Due to pt's short stay, pt was evaluated one time per protocol. The following assessment is the summation of that evaluation and was reviewed with Dr. Dwyane Dee. Discharge disposition/planning is included in this assessment.   Patient Identification:  Francisco Jacobs Date of Evaluation:  01/08/2014 Chief Complaint:  ETOH USE DISORDER,SEV  Subjective: Pt seen and chart reviewed. Pt denies SI, HI, and AVH, contracts for safety. Pt reports. That he has been consuming approximately 18-24 beers per day for the past year. Pt reports that his main stressor is that he recently got 2 women pregnant and that he "blacks out often and can't remember what happened". Pt reports that he has supposedly gotten into fights with friends but has no memory of it. Pt cites a good support system of friends but that all of his family is in [place] so there is "no one great here" to support him. Pt is open to seeking treatment at [facility] and other equivalent facilities. Pt given resources. Pt's current CIWA score is 0 and has been consistently low.   History of Present Illness:: Francisco Jacobs is an 33 y.o. male who presents voluntarily to [facility] seeking alcohol detox. Pt reported his probation officer suggested that he seek alcohol detox. Pt reported drinking alcohol daily for the past year. Pt reported hx of cocaine use but stated last use was 7-8 months ago. Pt reported that once he stopped using cocaine, his alcohol use increased greatly. Pt denied experiencing withdrawal symptoms but stated he was irritable and had a headache. Pt denies inpatient SA hx. Pt stated he is currently involved in [facility] program for the last 3 months for substance use. Pt denies SI, HI, and AVH. Pt Ox4. Pt reported anxious and irritable mood with anxious affect. Pt reported he is currently homeless and his family supports live in [place]. 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 Pt indicated his current stressors are due to him dealing with 2 different women that he got pregnant, which increases his urge to drink.    Total Time spent with patient: 40 minutes  Psychiatric Specialty Exam: Physical Exam Full Physical Exam performed in ED; reviewed, stable, and I concur with this assessment.   Review of Systems  Constitutional: Negative.   HENT: Negative.   Eyes: Negative.   Respiratory: Negative.   Cardiovascular: Negative.   Gastrointestinal: Negative.   Genitourinary: Negative.   Musculoskeletal: Negative.   Skin: Negative.   Neurological: Negative.   Endo/Heme/Allergies: Negative.   Psychiatric/Behavioral: Positive for depression and substance abuse. The patient is nervous/anxious.     Blood pressure 137/91, pulse 81, temperature 97.6 F (36.4 C), temperature source Oral, resp. rate 18, height _0  (1.803 m), weight 97.07 kg (214 lb).Body mass index is 29.86 kg/(m^2).  General Appearance: Fairly Groomed  Engineer, water::  Good  Speech:  Clear and Coherent  Volume:  Normal  Mood:  Anxious and Depressed  Affect:  Appropriate and Congruent  Thought Process:  Coherent and Goal Directed  Orientation:  Full (Time, Place, and Person)  Thought Content:  WDL  Suicidal Thoughts:  No  Homicidal Thoughts:  No  Memory:  Immediate;   Fair Recent;   Fair Remote;   Fair  Judgement:  Fair  Insight:  Good  Psychomotor Activity:  Normal  Concentration:  Fair  Recall:  [employer] of Knowledge:Good  Language: Fair  Akathisia:  No  Handed:    AIMS (if indicated):     Assets:  Communication Skills Desire for Improvement Resilience  Sleep:       Musculoskeletal: Strength & Muscle Tone: within normal limits Gait & Station: normal Patient leans: N/A   Past Medical History:  History reviewed. No pertinent past medical history. Loss of Consciousness:  Pt reports blackouts nearly every time he drinks. Allergies:  No Known Allergies PTA  Medications: No prescriptions prior to admission    Previous Psychotropic Medications:  Medication/Dose  SEE MAR               Substance Abuse History in the last 12 months:  yes  Consequences of Substance Abuse: Medical Consequences:  Hospitalization  Social History:  reports that he has been smoking Cigarettes.  He has been smoking about 0.50 packs per day. He has never used smokeless tobacco. He reports that he drinks alcohol. He reports that he uses illicit drugs (Cocaine) about 7 times per week. Additional Social History: History of alcohol / drug use?: Yes Negative Consequences of Use: Financial;Personal relationships;Legal Withdrawal Symptoms: Other (Comment) (none)                     Family History:  History reviewed. No pertinent family history.  Results for orders placed during the hospital encounter of 01/07/14 (from the past 72 hour(s))  ACETAMINOPHEN LEVEL     Status: None   Collection Time    01/07/14  6:29 PM      Result Value Ref Range   Acetaminophen (Tylenol), Serum <15.0  10 - 30 ug/mL   Comment:            THERAPEUTIC CONCENTRATIONS VARY     SIGNIFICANTLY. A RANGE OF 10-30     ug/mL MAY BE AN EFFECTIVE     CONCENTRATION FOR MANY PATIENTS.     HOWEVER, SOME ARE BEST TREATED     AT CONCENTRATIONS OUTSIDE THIS     RANGE.     ACETAMINOPHEN CONCENTRATIONS     >150 ug/mL AT 4 HOURS AFTER     INGESTION AND >50 ug/mL AT 12     HOURS AFTER INGESTION ARE     OFTEN ASSOCIATED WITH TOXIC     REACTIONS.  CBC     Status: None   Collection Time    01/07/14  6:29 PM      Result Value Ref Range   WBC 4.5  4.0 - 10.5 K/uL   RBC 4.26  4.22 - 5.81 MIL/uL   Hemoglobin 14.1  13.0 - 17.0 g/dL   HCT 41.1  39.0 - 52.0 %   MCV 96.5  78.0 - 100.0 fL   MCH 33.1  26.0 - 34.0 pg   MCHC 34.3  30.0 - 36.0 g/dL   RDW 13.1  11.5 - 15.5 %   Platelets 177  150 - 400 K/uL  COMPREHENSIVE METABOLIC PANEL     Status: Abnormal   Collection Time    01/07/14  6:29  PM      Result Value Ref Range   Sodium 141  137 - 147 mEq/L   Potassium 4.0  3.7 - 5.3 mEq/L   Chloride 103  96 - 112 mEq/L   CO2 21  19 - 32 mEq/L   Glucose, Bld 98  70 - 99 mg/dL   BUN 7  6 - 23 mg/dL   Creatinine, Ser 0.66  0.50 - 1.35 mg/dL   Calcium 9.5  8.4 - 10.5 mg/dL   Total Protein 8.2  6.0 - 8.3 g/dL   Albumin 3.9  3.5 - 5.2 g/dL   AST 182 (*) 0 - 37 U/L   ALT 180 (*) 0 - 53 U/L   Alkaline Phosphatase 86  39 - 117 U/L   Total Bilirubin 0.2 (*) 0.3 - 1.2 mg/dL   GFR calc non Af Amer >90  >90 mL/min   GFR calc Af Amer >90  >90 mL/min   Comment: (NOTE)     The eGFR has been calculated using the CKD EPI equation.     This calculation has not been validated in all clinical situations.     eGFR's persistently <90 mL/min signify possible Chronic Kidney     Disease.  ETHANOL     Status: Abnormal   Collection Time    01/07/14  6:29 PM      Result Value Ref Range   Alcohol, Ethyl (B) 163 (*) 0 - 11 mg/dL   Comment:            LOWEST DETECTABLE LIMIT FOR     SERUM ALCOHOL IS 11 mg/dL     FOR MEDICAL PURPOSES ONLY  SALICYLATE LEVEL     Status: Abnormal   Collection Time    01/07/14  6:29 PM      Result Value Ref Range   Salicylate Lvl <4.6 (*) 2.8 - 20.0 mg/dL  URINE RAPID DRUG SCREEN (HOSP PERFORMED)     Status: None   Collection Time    01/07/14  8:43 PM      Result Value Ref Range   Opiates NONE DETECTED  NONE DETECTED   Cocaine NONE DETECTED  NONE DETECTED   Benzodiazepines NONE DETECTED  NONE DETECTED   Amphetamines NONE DETECTED  NONE DETECTED   Tetrahydrocannabinol NONE DETECTED  NONE DETECTED   Barbiturates NONE DETECTED  NONE DETECTED   Comment:            DRUG SCREEN FOR MEDICAL PURPOSES     ONLY.  IF CONFIRMATION IS NEEDED     FOR ANY PURPOSE, NOTIFY LAB     WITHIN 5 DAYS.                LOWEST DETECTABLE LIMITS     FOR URINE DRUG SCREEN     Drug Class       Cutoff (ng/mL)     Amphetamine      1000     Barbiturate      200     Benzodiazepine   270      Tricyclics       350     Opiates          300     Cocaine          300     THC              50   Psychological Evaluations:  Assessment:   DSM5:  Substance/Addictive Disorders:  Alcohol Related Disorder - Severe (303.90) Depressive Disorders:  Major Depressive Disorder - Severe (296.23)  AXIS I:  Alcohol Abuse, Major Depression, Recurrent severe and Substance Induced Mood Disorder AXIS II:  Deferred AXIS III:  History reviewed. No pertinent past medical history. AXIS IV:  other psychosocial or environmental problems and problems related to social environment AXIS V:  51-60 moderate symptoms  Treatment Plan/Recommendations:   Review of chart, vital signs, medications, and notes.  1-Medication management for depression and anxiety: Medications  reviewed with the patient and she stated no untoward effects, unchanged. 2-Coping skills for depression, anxiety  3-Continue crisis stabilization and management  4-Address health issues--monitoring vital signs, stable  5-Treatment plan in progress to prevent relapse of depression and anxiety  Treatment Plan Summary: -Discharge home with outpatient resources for followup regarding alcohol detoxification.   Current Medications:  Current Facility-Administered Medications  Medication Dose Route Frequency Provider Last Rate Last Dose  . folic acid (FOLVITE) tablet 1 mg  1 mg Oral Daily Lurena Nida, NP   1 mg at 01/08/14 5391  . hydrOXYzine (ATARAX/VISTARIL) tablet 50 mg  50 mg Oral QHS PRN Lurena Nida, NP   50 mg at 01/08/14 0105  . LORazepam (ATIVAN) tablet 1 mg  1 mg Oral Q6H PRN Lurena Nida, NP   1 mg at 01/08/14 0106   Or  . LORazepam (ATIVAN) injection 1 mg  1 mg Intravenous Q6H PRN Lurena Nida, NP      . LORazepam (ATIVAN) tablet 0-4 mg  0-4 mg Oral Q6H Lurena Nida, NP       Followed by  . [START ON 01/10/2014] LORazepam (ATIVAN) tablet 0-4 mg  0-4 mg Oral Q12H Lurena Nida, NP      . multivitamin with minerals tablet 1  tablet  1 tablet Oral Daily Lurena Nida, NP   1 tablet at 01/08/14 [phone]  . thiamine (VITAMIN B-1) tablet 100 mg  100 mg Oral Daily Lurena Nida, NP   100 mg at 01/08/14 3462   Or  . thiamine (B-1) injection 100 mg  100 mg Intramuscular Daily Lurena Nida, NP       Benjamine Mola, FNP-BC 01/08/2014 5:32 PM

## 2014-01-08 NOTE — Progress Notes (Signed)
[facility] INPATIENT:  Family/Significant Other Suicide Prevention Education  Suicide Prevention Education:  Patient Refusal for Family/Significant Other Suicide Prevention Education: The patient Francisco Jacobs has refused to provide written consent for family/significant other to be provided Family/Significant Other Suicide Prevention Education during admission and/or prior to discharge.  Physician notified.  Winfield Cunas 01/08/2014, 12:23 AM

## 2014-02-03 ENCOUNTER — Encounter (HOSPITAL_COMMUNITY): Payer: Self-pay | Admitting: Emergency Medicine

## 2014-02-03 ENCOUNTER — Emergency Department (HOSPITAL_COMMUNITY): Payer: Self-pay

## 2014-02-03 ENCOUNTER — Emergency Department (HOSPITAL_COMMUNITY)
Admission: EM | Admit: 2014-02-03 | Discharge: 2014-02-04 | Disposition: A | Discharge status: Home / Self Care | Payer: Self-pay | Attending: Emergency Medicine | Admitting: Emergency Medicine

## 2014-02-03 DIAGNOSIS — F17200 Nicotine dependence, unspecified, uncomplicated: Secondary | ICD-10-CM | POA: Insufficient documentation

## 2014-02-03 DIAGNOSIS — IMO0002 Reserved for concepts with insufficient information to code with codable children: Secondary | ICD-10-CM | POA: Insufficient documentation

## 2014-02-03 DIAGNOSIS — Z23 Encounter for immunization: Secondary | ICD-10-CM | POA: Insufficient documentation

## 2014-02-03 DIAGNOSIS — S0230XA Fracture of orbital floor, unspecified side, initial encounter for closed fracture: Secondary | ICD-10-CM | POA: Insufficient documentation

## 2014-02-03 DIAGNOSIS — R404 Transient alteration of awareness: Secondary | ICD-10-CM | POA: Insufficient documentation

## 2014-02-03 DIAGNOSIS — H532 Diplopia: Secondary | ICD-10-CM | POA: Insufficient documentation

## 2014-02-03 DIAGNOSIS — S023XXA Fracture of orbital floor, initial encounter for closed fracture: Secondary | ICD-10-CM

## 2014-02-03 MED ORDER — TETANUS-DIPHTH-ACELL PERTUSSIS 5-2.5-18.5 LF-MCG/0.5 IM SUSP
0.5000 mL | Freq: Once | INTRAMUSCULAR | Status: AC
  Administered 2014-02-03: 0.5 mL via INTRAMUSCULAR
  Filled 2014-02-03: qty 0.5

## 2014-02-03 MED ORDER — TETRACAINE HCL 0.5 % OP SOLN
2.0000 [drp] | Freq: Once | OPHTHALMIC | Status: AC
  Administered 2014-02-03: 2 [drp] via OPHTHALMIC
  Filled 2014-02-03: qty 2

## 2014-02-03 MED ORDER — FLUORESCEIN SODIUM 1 MG OP STRP
1.0000 | ORAL_STRIP | Freq: Once | OPHTHALMIC | Status: AC
  Administered 2014-02-03: 1 via OPHTHALMIC
  Filled 2014-02-03: qty 1

## 2014-02-03 NOTE — ED Notes (Signed)
gpd at bedside 

## 2014-02-03 NOTE — ED Notes (Signed)
MD at bedside. 
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
## 2014-02-03 NOTE — ED Notes (Signed)
Patient transported to CT 

## 2014-02-03 NOTE — ED Notes (Signed)
Bed: WA21 Expected date:  Expected time:  Means of arrival:  Comments: Tr 1
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
## 2014-02-04 MED ORDER — IBUPROFEN 800 MG PO TABS: 800.0000 mg | ORAL_TABLET | Freq: Three times a day (TID) | ORAL | Status: DC

## 2014-02-04 MED ORDER — IBUPROFEN 800 MG PO TABS
ORAL_TABLET | ORAL | Status: AC
  Filled 2014-02-04: qty 1

## 2014-02-04 NOTE — Discharge Instructions (Signed)
1. Medications: ibuprofen, usual home medications 2. Treatment: rest, drink plenty of fluids,  3. Follow Up: Please followup with Dr. Suszanne Connerseoh as directed on your paperwork   Facial Fracture A facial fracture is a break in one of the bones of your face. HOME CARE INSTRUCTIONS   Protect the injured part of your face until it is healed.  Do not participate in activities which give chance for re-injury until your doctor approves.  Gently wash and dry your face.  Wear head and facial protection while riding a bicycle, motorcycle, or snowmobile. SEEK MEDICAL CARE IF:   An oral temperature above 102 F (38.9 C) develops.  You have severe headaches or notice changes in your vision.  You have new numbness or tingling in your face.  You develop nausea (feeling sick to your stomach), vomiting or a stiff neck. SEEK IMMEDIATE MEDICAL CARE IF:   You develop difficulty seeing or experience double vision.  You become dizzy, lightheaded, or faint.  You develop trouble speaking, breathing, or swallowing.  You have a watery discharge from your nose or ear. MAKE SURE YOU:   Understand these instructions.  Will watch your condition.  Will get help right away if you are not doing well or get worse. Document Released: 07/20/2005 Document Revised: 10/12/2011 Document Reviewed: 03/08/2008 [facility] Patient Information 2015 [employer] [place]. This information is not intended to replace advice given to you by your health care provider. Make sure you discuss any questions you have with your health care provider.

## 2014-02-04 NOTE — ED Provider Notes (Signed)
Medical screening examination/treatment/procedure(s) were performed by non-physician practitioner and as supervising physician I was immediately available for consultation/collaboration.   EKG Interpretation None       Jamani Bearce K Linker, MD 02/04/14 1512 

## 2014-02-08 ENCOUNTER — Ambulatory Visit (INDEPENDENT_AMBULATORY_CARE_PROVIDER_SITE_OTHER): Payer: Self-pay | Admitting: Otolaryngology

## 2014-02-08 DIAGNOSIS — S0230XA Fracture of orbital floor, unspecified side, initial encounter for closed fracture: Secondary | ICD-10-CM

## 2014-05-11 ENCOUNTER — Encounter (HOSPITAL_COMMUNITY): Payer: Self-pay | Admitting: Emergency Medicine

## 2014-05-11 ENCOUNTER — Inpatient Hospital Stay (HOSPITAL_COMMUNITY)
Admission: EM | Admit: 2014-05-11 | Discharge: 2014-05-12 | DRG: 641 | Disposition: A | Discharge status: Home / Self Care | Payer: Self-pay | Attending: Oncology | Admitting: Oncology

## 2014-05-11 DIAGNOSIS — F1910 Other psychoactive substance abuse, uncomplicated: Secondary | ICD-10-CM

## 2014-05-11 DIAGNOSIS — F10129 Alcohol abuse with intoxication, unspecified: Secondary | ICD-10-CM

## 2014-05-11 DIAGNOSIS — Y906 Blood alcohol level of 120-199 mg/100 ml: Secondary | ICD-10-CM | POA: Diagnosis present

## 2014-05-11 DIAGNOSIS — F22 Delusional disorders: Secondary | ICD-10-CM | POA: Diagnosis present

## 2014-05-11 DIAGNOSIS — F10229 Alcohol dependence with intoxication, unspecified: Secondary | ICD-10-CM | POA: Diagnosis present

## 2014-05-11 DIAGNOSIS — F1410 Cocaine abuse, uncomplicated: Secondary | ICD-10-CM | POA: Diagnosis present

## 2014-05-11 DIAGNOSIS — F10959 Alcohol use, unspecified with alcohol-induced psychotic disorder, unspecified: Secondary | ICD-10-CM

## 2014-05-11 DIAGNOSIS — E872 Acidosis, unspecified: Secondary | ICD-10-CM

## 2014-05-11 DIAGNOSIS — F10929 Alcohol use, unspecified with intoxication, unspecified: Secondary | ICD-10-CM | POA: Diagnosis present

## 2014-05-11 DIAGNOSIS — Z23 Encounter for immunization: Secondary | ICD-10-CM

## 2014-05-11 HISTORY — DX: Migraine, unspecified, not intractable, without status migrainosus: G43.909

## 2014-05-11 LAB — BASIC METABOLIC PANEL
Anion gap: 29 — ABNORMAL HIGH (ref 5–15)
BUN: 12 mg/dL (ref 6–23)
CHLORIDE: 93 meq/L — AB (ref 96–112)
CO2: 12 mEq/L — ABNORMAL LOW (ref 19–32)
Calcium: 9.4 mg/dL (ref 8.4–10.5)
Creatinine, Ser: 1.01 mg/dL (ref 0.50–1.35)
GFR calc non Af Amer: >90 mL/min (ref >90)
Glucose, Bld: 48 mg/dL — ABNORMAL LOW (ref 70–99)
POTASSIUM: 4.1 meq/L (ref 3.7–5.3)
Sodium: 134 mEq/L — ABNORMAL LOW (ref 137–147)

## 2014-05-11 LAB — RAPID URINE DRUG SCREEN, HOSP PERFORMED
Amphetamines: NOT DETECTED
BARBITURATES: NOT DETECTED
Benzodiazepines: NOT DETECTED
COCAINE: POSITIVE — AB
OPIATES: NOT DETECTED
Tetrahydrocannabinol: NOT DETECTED

## 2014-05-11 LAB — HEPATIC FUNCTION PANEL
ALK PHOS: 60 U/L (ref 39–117)
ALT: 25 U/L (ref 0–53)
AST: 42 U/L — ABNORMAL HIGH (ref 0–37)
Albumin: 4 g/dL (ref 3.5–5.2)
BILIRUBIN TOTAL: 0.5 mg/dL (ref 0.3–1.2)
Bilirubin, Direct: <0.2 mg/dL (ref 0.0–0.3)
TOTAL PROTEIN: 7.9 g/dL (ref 6.0–8.3)

## 2014-05-11 LAB — CBC WITH DIFFERENTIAL/PLATELET
BASOS ABS: 0 10*3/uL (ref 0.0–0.1)
BASOS PCT: 0 % (ref 0–1)
Eosinophils Absolute: 0 10*3/uL (ref 0.0–0.7)
Eosinophils Relative: 0 % (ref 0–5)
HCT: 40.8 % (ref 39.0–52.0)
Hemoglobin: 14.6 g/dL (ref 13.0–17.0)
Lymphocytes Relative: 15 % (ref 12–46)
Lymphs Abs: 1.3 10*3/uL (ref 0.7–4.0)
MCH: 32.7 pg (ref 26.0–34.0)
MCHC: 35.8 g/dL (ref 30.0–36.0)
MCV: 91.3 fL (ref 78.0–100.0)
Monocytes Absolute: 0.6 10*3/uL (ref 0.1–1.0)
Monocytes Relative: 7 % (ref 3–12)
NEUTROS ABS: 6.6 10*3/uL (ref 1.7–7.7)
NEUTROS PCT: 78 % — AB (ref 43–77)
Platelets: 248 10*3/uL (ref 150–400)
RBC: 4.47 MIL/uL (ref 4.22–5.81)
RDW: 12.4 % (ref 11.5–15.5)
WBC: 8.5 10*3/uL (ref 4.0–10.5)

## 2014-05-11 LAB — I-STAT ARTERIAL BLOOD GAS, ED
ACID-BASE DEFICIT: 10 mmol/L — AB (ref 0.0–2.0)
BICARBONATE: 16.1 meq/L — AB (ref 20.0–24.0)
O2 Saturation: 89 %
PH ART: 7.285 — AB (ref 7.350–7.450)
TCO2: 17 mmol/L (ref 0–100)
pCO2 arterial: 33.8 mmHg — ABNORMAL LOW (ref 35.0–45.0)
pO2, Arterial: 61 mmHg — ABNORMAL LOW (ref 80.0–100.0)

## 2014-05-11 LAB — URINALYSIS, ROUTINE W REFLEX MICROSCOPIC
BILIRUBIN URINE: NEGATIVE
Glucose, UA: NEGATIVE mg/dL
HGB URINE DIPSTICK: NEGATIVE
KETONES UR: 15 mg/dL — AB
Leukocytes, UA: NEGATIVE
Nitrite: NEGATIVE
PROTEIN: NEGATIVE mg/dL
Specific Gravity, Urine: 1.019 (ref 1.005–1.030)
Urobilinogen, UA: 0.2 mg/dL (ref 0.0–1.0)
pH: 6 (ref 5.0–8.0)

## 2014-05-11 LAB — ACETAMINOPHEN LEVEL

## 2014-05-11 LAB — I-STAT VENOUS BLOOD GAS, ED
Acid-base deficit: 8 mmol/L — ABNORMAL HIGH (ref 0.0–2.0)
Bicarbonate: 18.1 mEq/L — ABNORMAL LOW (ref 20.0–24.0)
O2 Saturation: 67 %
PO2 VEN: 39 mmHg (ref 30.0–45.0)
TCO2: 19 mmol/L (ref 0–100)
pCO2, Ven: 38.7 mmHg — ABNORMAL LOW (ref 45.0–50.0)
pH, Ven: 7.278 (ref 7.250–7.300)

## 2014-05-11 LAB — SALICYLATE LEVEL: Salicylate Lvl: <2 mg/dL — ABNORMAL LOW (ref 2.8–20.0)

## 2014-05-11 LAB — LACTIC ACID, PLASMA: Lactic Acid, Venous: 5.3 mmol/L — ABNORMAL HIGH (ref 0.5–2.2)

## 2014-05-11 LAB — MRSA PCR SCREENING: MRSA BY PCR: NEGATIVE

## 2014-05-11 LAB — OSMOLALITY: Osmolality: 302 mOsm/kg — ABNORMAL HIGH (ref 275–300)

## 2014-05-11 LAB — ETHANOL: Alcohol, Ethyl (B): 153 mg/dL — ABNORMAL HIGH (ref 0–11)

## 2014-05-11 LAB — CK: Total CK: 1185 U/L — ABNORMAL HIGH (ref 7–232)

## 2014-05-11 LAB — I-STAT CG4 LACTIC ACID, ED: Lactic Acid, Venous: 7.35 mmol/L — ABNORMAL HIGH (ref 0.5–2.2)

## 2014-05-11 MED ORDER — LORAZEPAM 2 MG/ML IJ SOLN: 1.0000 mg | Freq: Four times a day (QID) | INTRAMUSCULAR | Status: DC | PRN

## 2014-05-11 MED ORDER — ADULT MULTIVITAMIN W/MINERALS CH
1.0000 | ORAL_TABLET | Freq: Every day | ORAL | Status: DC
  Administered 2014-05-11 – 2014-05-12 (×2): 1 via ORAL
  Filled 2014-05-11 (×2): qty 1

## 2014-05-11 MED ORDER — SODIUM CHLORIDE 0.9 % IV BOLUS (SEPSIS): 2000.0000 mL | Freq: Once | INTRAVENOUS | Status: DC

## 2014-05-11 MED ORDER — INFLUENZA VAC SPLIT QUAD 0.5 ML IM SUSY
0.5000 mL | PREFILLED_SYRINGE | INTRAMUSCULAR | Status: AC
  Administered 2014-05-12: 0.5 mL via INTRAMUSCULAR
  Filled 2014-05-11: qty 0.5

## 2014-05-11 MED ORDER — ACETAMINOPHEN 325 MG PO TABS
650.0000 mg | ORAL_TABLET | Freq: Four times a day (QID) | ORAL | Status: DC | PRN
  Administered 2014-05-11: 650 mg via ORAL
  Filled 2014-05-11: qty 2

## 2014-05-11 MED ORDER — VITAMIN B-1 100 MG PO TABS
100.0000 mg | ORAL_TABLET | Freq: Every day | ORAL | Status: DC
  Administered 2014-05-11 – 2014-05-12 (×2): 100 mg via ORAL
  Filled 2014-05-11 (×2): qty 1

## 2014-05-11 MED ORDER — SODIUM CHLORIDE 0.9 % IV SOLN
INTRAVENOUS | Status: DC
  Administered 2014-05-11: 21:00:00 via INTRAVENOUS

## 2014-05-11 MED ORDER — DEXTROSE 50 % IV SOLN
50.0000 mL | Freq: Once | INTRAVENOUS | Status: AC
  Administered 2014-05-11: 50 mL via INTRAVENOUS
  Filled 2014-05-11: qty 50

## 2014-05-11 MED ORDER — HEPARIN SODIUM (PORCINE) 5000 UNIT/ML IJ SOLN
5000.0000 [IU] | Freq: Three times a day (TID) | INTRAMUSCULAR | Status: DC
  Administered 2014-05-11 – 2014-05-12 (×2): 5000 [IU] via SUBCUTANEOUS
  Filled 2014-05-11 (×5): qty 1

## 2014-05-11 MED ORDER — SODIUM CHLORIDE 0.9 % IV BOLUS (SEPSIS)
2000.0000 mL | Freq: Once | INTRAVENOUS | Status: AC
  Administered 2014-05-11: 2000 mL via INTRAVENOUS

## 2014-05-11 MED ORDER — THIAMINE HCL 100 MG/ML IJ SOLN
100.0000 mg | Freq: Every day | INTRAMUSCULAR | Status: DC
  Filled 2014-05-11 (×2): qty 1

## 2014-05-11 MED ORDER — FOLIC ACID 1 MG PO TABS
1.0000 mg | ORAL_TABLET | Freq: Every day | ORAL | Status: DC
  Administered 2014-05-11 – 2014-05-12 (×2): 1 mg via ORAL
  Filled 2014-05-11 (×2): qty 1

## 2014-05-11 MED ORDER — LORAZEPAM 1 MG PO TABS: 1.0000 mg | ORAL_TABLET | Freq: Four times a day (QID) | ORAL | Status: DC | PRN

## 2014-05-11 MED ORDER — SODIUM CHLORIDE 0.9 % IV BOLUS (SEPSIS)
1000.0000 mL | Freq: Once | INTRAVENOUS | Status: AC
  Administered 2014-05-11: 1000 mL via INTRAVENOUS

## 2014-05-11 NOTE — Progress Notes (Signed)
ABG results given to MD.  

## 2014-05-11 NOTE — ED Notes (Signed)
[employer] at bedside 

## 2014-05-11 NOTE — Progress Notes (Signed)
RN called ED for report.  

## 2014-05-11 NOTE — Progress Notes (Addendum)
NURSING PROGRESS NOTE  Francisco Jacobs [id number] Admission Data: 05/11/2014 6:27 PM Attending Provider: Levert FeinsteinJames M Granfortuna, MD PCP:No PCP Per Patient Code Status: full   Francisco MediciMarcus Jacobs is a 33 y.o. male patient admitted from ED:  -No acute distress noted.  -No complaints of shortness of breath.  -No complaints of chest pain.    Blood pressure 148/82, pulse 90, temperature 98 F (36.7 C), temperature source Oral, resp. rate 20, SpO2 98.00%.   IV Fluids:  IV in place, occlusive dsg intact without redness, IV cath antecubital left, condition patent and no redness normal saline lock.   Allergies:  Review of patient's allergies indicates no known allergies.  Past Medical History:   has no past medical history on file.  Past Surgical History:   has no past surgical history on file.  Social History:   reports that he has been smoking Cigarettes.  He has been smoking about 0.50 packs per day. He has never used smokeless tobacco. He reports that he drinks alcohol. He reports that he uses illicit drugs (Cocaine) about 7 times per week.  Skin: abrasion to bilateral knees and feet.   Patient/Family orientated to room. Information packet given to patient/family. Admission inpatient armband information verified with patient/family to include name and date of birth and placed on patient arm. Side rails up x 2, fall assessment and education completed with patient/family. Patient/family able to verbalize understanding of risk associated with falls and verbalized understanding to call for assistance before getting out of bed. Call light within reach. Patient/family able to voice and demonstrate understanding of unit orientation instructions.    Will continue to evaluate and treat per MD orders.
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
## 2014-05-11 NOTE — ED Notes (Signed)
Attempted to call report x 1  
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
## 2014-05-12 DIAGNOSIS — F22 Delusional disorders: Secondary | ICD-10-CM

## 2014-05-12 DIAGNOSIS — R441 Visual hallucinations: Secondary | ICD-10-CM

## 2014-05-12 DIAGNOSIS — F10929 Alcohol use, unspecified with intoxication, unspecified: Secondary | ICD-10-CM | POA: Diagnosis present

## 2014-05-12 DIAGNOSIS — F1910 Other psychoactive substance abuse, uncomplicated: Secondary | ICD-10-CM

## 2014-05-12 DIAGNOSIS — F10959 Alcohol use, unspecified with alcohol-induced psychotic disorder, unspecified: Secondary | ICD-10-CM

## 2014-05-12 DIAGNOSIS — F10159 Alcohol abuse with alcohol-induced psychotic disorder, unspecified: Secondary | ICD-10-CM

## 2014-05-12 LAB — CBC
HCT: 36.5 % — ABNORMAL LOW (ref 39.0–52.0)
HEMOGLOBIN: 12.7 g/dL — AB (ref 13.0–17.0)
MCH: 31.7 pg (ref 26.0–34.0)
MCHC: 34.8 g/dL (ref 30.0–36.0)
MCV: 91 fL (ref 78.0–100.0)
PLATELETS: 195 10*3/uL (ref 150–400)
RBC: 4.01 MIL/uL — AB (ref 4.22–5.81)
RDW: 12.6 % (ref 11.5–15.5)
WBC: 5.8 10*3/uL (ref 4.0–10.5)

## 2014-05-12 LAB — COMPREHENSIVE METABOLIC PANEL
ALT: 22 U/L (ref 0–53)
AST: 44 U/L — ABNORMAL HIGH (ref 0–37)
Albumin: 3.5 g/dL (ref 3.5–5.2)
Alkaline Phosphatase: 55 U/L (ref 39–117)
Anion gap: 15 (ref 5–15)
BUN: 11 mg/dL (ref 6–23)
CALCIUM: 8.7 mg/dL (ref 8.4–10.5)
CO2: 22 meq/L (ref 19–32)
CREATININE: 0.93 mg/dL (ref 0.50–1.35)
Chloride: 101 mEq/L (ref 96–112)
Glucose, Bld: 80 mg/dL (ref 70–99)
Potassium: 3.8 mEq/L (ref 3.7–5.3)
SODIUM: 138 meq/L (ref 137–147)
TOTAL PROTEIN: 7 g/dL (ref 6.0–8.3)
Total Bilirubin: 0.9 mg/dL (ref 0.3–1.2)

## 2014-05-12 LAB — CK: Total CK: 2187 U/L — ABNORMAL HIGH (ref 7–232)

## 2014-05-12 LAB — LACTIC ACID, PLASMA: LACTIC ACID, VENOUS: 0.7 mmol/L (ref 0.5–2.2)

## 2014-05-12 LAB — HIV ANTIBODY (ROUTINE TESTING W REFLEX): HIV 1&2 Ab, 4th Generation: NONREACTIVE

## 2014-05-12 MED ORDER — SODIUM CHLORIDE 0.9 % IV SOLN: INTRAVENOUS | Status: AC

## 2014-05-12 NOTE — Discharge Instructions (Signed)
Chemical Dependency  Chemical dependency is an addiction to drugs or alcohol. It is characterized by the repeated behavior of seeking out and using drugs and alcohol despite harmful consequences to the health and safety of ones self and others.   RISK FACTORS  There are certain situations or behaviors that increase a person's risk for chemical dependency. These include:  · A family history of chemical dependency.  · A history of mental health issues, including depression and anxiety.  · A home environment where drugs and alcohol are easily available to you.  · Drug or alcohol use at a young age.  SYMPTOMS   The following symptoms can indicate chemical dependency:  · Inability to limit the use of drugs or alcohol.  · Nausea, sweating, shakiness, and anxiety that occurs when alcohol or drugs are not being used.  · An increase in amount of drugs or alcohol that is necessary to get drunk or high.  People who experience these symptoms can assess their use of drugs and alcohol by asking themselves the following questions:  · Have you been told by friends or family that they are worried about your use of alcohol or drugs?  · Do friends and family ever tell you about things you did while drinking alcohol or using drugs that you do not remember?  · Do you lie about using alcohol or drugs or about the amounts you use?  · Do you have difficulty completing daily tasks unless you use alcohol or drugs?  · Is the level of your work or school performance lower because of your drug or alcohol use?  · Do you get sick from using drugs or alcohol but keep using anyway?  · Do you feel uncomfortable in social situations unless you use alcohol or drugs?  · Do you use drugs or alcohol to help forget problems?   An answer of yes to any of these questions may indicate chemical dependency. Professional evaluation is suggested.  Document Released: 07/14/2001 Document Revised: 10/12/2011 Document Reviewed: 09/25/2010  [employer]® Patient  Information ©2015 [employer]. This information is not intended to replace advice given to you by your health care provider. Make sure you discuss any questions you have with your health care provider.

## 2014-05-12 NOTE — Progress Notes (Signed)
Pt given bus pass per request. Escorted out by [facility].

## 2014-05-12 NOTE — Discharge Summary (Signed)
Name: Francisco Jacobs MRN: [id number] DOB: [birth year] 33 y.o. 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      Disposition and follow-up:   Francisco Jacobs was [facility] St[facility].  At the hospital follow up visit please address:  1.  Francisco Jacobs was brought in by police with persecutory delusions and visual hallucinations, likely brought on by his heavy alcohol and cocaine use.   While he was in the ED, he was found to have a high anion gap with metabolic acidosis likely secondary to lactic acidosis (to 7.35). His gap and lactic acidosis resolved with gentle hydration. He had been found running from "persecutors" the day of admission, so his labs may be explained by intense exercise along with his alcohol ingestion. Discharge lactic acid 0.7.  He is interested in alcohol cessation and resources in the community were discussed with the patient (despite the patient's request to leave prior to SW visit). He has used some of these resources in the past and [facility].  2.  Labs / imaging needed at time of follow-up: none  3.  Pending labs/ test needing follow-up: none  Follow-up Appointments:     Follow-up Information   Schedule an appointment as soon as possible for a visit with [facility]    .   Contact information:   [address] [place] [phone]      Discharge Instructions:  Chemical Dependency    Chemical dependency is an addiction to drugs or alcohol. It is characterized by the repeated behavior of  seeking out and using drugs and alcohol despite harmful consequences to the health and safety of ones self and others.  RISK FACTORS  There are certain situations or behaviors that increase a person's risk for chemical dependency. These include:  A family history of chemical dependency.  A history of mental health issues, including depression and anxiety.  A home environment where drugs and alcohol are easily available to you.  Drug or alcohol use at a young age. SYMPTOMS  The following symptoms can indicate chemical dependency:  Inability to limit the use of drugs or alcohol.  Nausea, sweating, shakiness, and anxiety that occurs when alcohol or drugs are not being used.  An increase in amount of drugs or alcohol that is necessary to get drunk or high. People who experience these symptoms can assess their use of drugs and alcohol by asking themselves the following questions:  Have you been told by friends or family that they are worried about your use of alcohol or drugs?  Do friends and family ever tell you about things you did while drinking alcohol or using drugs that you do not remember?  Do you lie about using alcohol or drugs or about the amounts you use?  Do you have difficulty completing daily tasks unless you use alcohol or drugs?  Is the level of your work or school performance lower because of your drug or alcohol use?  Do you get sick from using drugs or alcohol but keep using anyway?  Do you feel uncomfortable  in social situations unless you use alcohol or drugs?  Do you use drugs or alcohol to help forget problems?  An answer of yes to any of these questions may indicate chemical dependency. Professional evaluation is suggested.  Document Released: 07/14/2001 Document Revised: 10/12/2011 Document Reviewed: 09/25/2010  [facility] Patient Information 2015 [place], [place]. This information is not intended to replace advice given to you by your health care provider. Make sure you  discuss any questions you have with your health care provider.  Consultations:  none  Procedures Performed:  No results found.  Admission HPI:  Francisco Jacobs is a 33 yo man with a PMH of polysubstance abuse who was brought in by the police today after he was found running through traffic. Per police, the patient was rolling under moving cars, trying to open the doors of cars stopped at stoplights, and claiming to see a man with a gun once in the police car. When recounting the morning's events, the patient says that he was being chased by a "known contact whose name I don't want to say". This man has apparently chased him in the past. He says that he knows the man "from the streets" and knows that this man "and his boys" will kill him if they get the chance. He states that they had guns with them when they were chasing him today. The patient admits to cocaine and alcohol intake this morning (18 beers). He denies seeing or hearing the man who had chased him while in the hospital today. He denies ever seeing or hearing things that other people do not see or hear. He denies any depression or SI/HI. He denies ingesting anything other than alcohol and cocaine this morning. He denies any other symptoms. He was recently released from [place] (4 days ago); he had been there for 2.5 months.   While in the ED, he was found to have an anion gap of 29 and an i-stat lactic acid of 7.35 --> 5.3. Salicylate level was found to be WNL. Alcohol 153 and cocaine +.  Of note, he voluntarily presented to the [facility] in 01/2014 seeking alcohol detox and at that time, was a part of the [facility] for substance [facility]. He states today that he is interested in help with alcohol cessation.   Axis I: Alcohol use disorder; paranoid delusions of uncertain etiology  Axis II: Deferred  Axis III: No pertinent PMH  Axis IV: History of housing problems (currently living in a boarding house), history of recent 2 month  imprisonment at [facility] (got out 3 days ago)   Admission PE:  Appearance: in NAD, lying in bed in the ED, slightly uncertain about having several interviewers  HEENT: AT/[place], slight edema OS, EOMi, unequal pupils (R 5 mm, L 7 mm), right reacts briskly, left slightly delayed  Heart: RRR, normal S1S2, no murmurs  Lungs: CTAB, no wheezing  Abdomen: BS+, soft, nontender  Musculoskeletal: surface injuries that are red but not actively bleeding on both knees. R knee wrapped with 2.5x3cm lesion and smaller, swollen lesion superior. L knee unwrapped with 3x2.5cm lesion. 1x1cm lesion on right lateral ankle, slightly swollen, and left dorsal foot. Slight tenderness to palpation in left scaphoid, but no swelling.  Neurologic: A&Ox3, CN II-XII intact, strength and sensation intact throughout  Psychiatric: malodorous, slightly disheveled, calm, cooperative, slightly apprehensive about having multiple interviewers, no abnormal motor movements, normal mood, normal affect, thought process clear, linear, goal-directed, focused on day's events, previous visual  hallucinations as per police report, but patient denies these. No auditory hallucinations. No perceptional disturbances. Judgement fair. Poor insight into drug use and possible psychosis.    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 While in the ED, his symptoms subsided, but he was found to have a high anion gap metabolic acidosis and a lactic acidosis. The abnormal labs resolved over his first night of hospitalization with gentle hydration. He had no further hallucinations or delusions while in the hospital. He is interested in help with his  alcohol cessation.   High anion gap, metabolic acidosis likely secondary to lactic acidosis: patient's ABG was initially significant for pH of 7.285 with corresponding pCO2 of 33.8 and bicarbonate of 16.1. Anion gap is 29 with elevated lactic acid of 7.35, indicating that his acidosis is likely from elevated lactate levels. While initially, all causes were considered, his lactic acid rapidly normalized to 0.7 this morning. Perhaps his intense physical activity yesterday (which was corroborated by police and by his elevated CK of 1185) and his heavy alcohol ingestion combined to cause these initial lab results. He continues to deny ingestion of any other substances and remains afebrile and without a white count. Serum osmolality 302 (slightly elevated). HIV nonreactive. No need to repeat ABG was necessary.  Persecutory Delusions and Visual Hallucinations: Resolved. Likely substance-induced psychosis, from either his alcohol or cocaine use. This may have been brought on by the fact that the patient had used neither alcohol nor cocaine for several months while in [place], then used large amounts over the past 4 days.   Polysubstance Abuse: Patient was recently incarcerated, but admits to drinking 10-15 cans of beer for the past 4 days (including morning of admission). Patient has noticed tremors when withdrawing from alcohol in prison, but denies any history of withdrawal seizures. CIWA was 3 on the morning of discharge. SW was asked to visit the patient with cessation counseling and outpatient resources, as patient is interested in quitting his alcohol use; however, patient wished to leave when SW was delayed in reaching him (as it was a weekend, and SW was busy). The prior resources that the patient used were discussed with the patient, and he preferred [facility] [place]; had some success in the past through them. Plans to return. Patient also [facility] for medical follow-up.  Discharge Vitals:   BP 137/81  Pulse 93  Temp(Src) 98.4 F (36.9 C) (Oral)  Resp 19  SpO2 97%  Discharge Labs:  Results for orders placed during the hospital encounter of 05/11/14 (from the past 24 hour(s))  CBC WITH DIFFERENTIAL     Status: Abnormal   Collection Time    05/11/14  1:10 PM      Result Value Ref Range   WBC 8.5  4.0 - 10.5 K/uL   RBC 4.47  4.22 - 5.81 MIL/uL   Hemoglobin 14.6  13.0 - 17.0 g/dL   HCT 16.1  09.6 - 04.5 %   MCV 91.3  78.0 - 100.0 fL   MCH 32.7  26.0 - 34.0 pg   MCHC 35.8  30.0 - 36.0 g/dL   RDW 40.9  81.1 -  15.5 %   Platelets 248  150 - 400 K/uL   Neutrophils Relative % 78 (*) 43 - 77 %   Neutro Abs 6.6  1.7 - 7.7 K/uL   Lymphocytes Relative 15  12 - 46 %   Lymphs Abs 1.3  0.7 - 4.0 K/uL   Monocytes Relative 7  3 - 12 %   Monocytes Absolute 0.6  0.1 - 1.0 K/uL   Eosinophils Relative 0  0 - 5 %   Eosinophils Absolute 0.0  0.0 - 0.7 K/uL   Basophils Relative 0  0 - 1 %   Basophils Absolute 0.0  0.0 - 0.1 K/uL  BASIC METABOLIC PANEL     Status: Abnormal   Collection Time    05/11/14  1:10 PM      Result Value Ref Range   Sodium 134 (*) 137 - 147 mEq/L   Potassium 4.1  3.7 - 5.3 mEq/L   Chloride 93 (*) 96 - 112 mEq/L   CO2 12 (*) 19 - 32 mEq/L   Glucose, Bld 48 (*) 70 - 99 mg/dL   BUN 12  6 - 23 mg/dL   Creatinine, Ser 1.61  0.50 - 1.35 mg/dL   Calcium 9.4  8.4 - 09.6 mg/dL   GFR calc non Af Amer >90  >90 mL/min   GFR calc Af Amer >90  >90 mL/min   Anion gap 29 (*) 5 - 15  ETHANOL     Status: Abnormal   Collection Time    05/11/14  1:10 PM      Result Value Ref Range   Alcohol, Ethyl (B) 153 (*) 0 - 11 mg/dL  SALICYLATE LEVEL     Status: Abnormal   Collection Time    05/11/14  1:10 PM      Result Value Ref Range   Salicylate Lvl <2.0 (*) 2.8 - 20.0 mg/dL  URINE RAPID DRUG SCREEN (HOSP PERFORMED)     Status: Abnormal   Collection Time    05/11/14  1:53 PM      Result Value Ref Range   Opiates NONE  DETECTED  NONE DETECTED   Cocaine POSITIVE (*) NONE DETECTED   Benzodiazepines NONE DETECTED  NONE DETECTED   Amphetamines NONE DETECTED  NONE DETECTED   Tetrahydrocannabinol NONE DETECTED  NONE DETECTED   Barbiturates NONE DETECTED  NONE DETECTED  ACETAMINOPHEN LEVEL     Status: None   Collection Time    05/11/14  2:49 PM      Result Value Ref Range   Acetaminophen (Tylenol), Serum <15.0  10 - 30 ug/mL  I-STAT VENOUS BLOOD GAS, ED     Status: Abnormal   Collection Time    05/11/14  3:12 PM      Result Value Ref Range   pH, Ven 7.278  7.250 - 7.300   pCO2, Ven 38.7 (*) 45.0 - 50.0 mmHg   pO2, Ven 39.0  30.0 - 45.0 mmHg   Bicarbonate 18.1 (*) 20.0 - 24.0 mEq/L   TCO2 19  0 - 100 mmol/L   O2 Saturation 67.0     Acid-base deficit 8.0 (*) 0.0 - 2.0 mmol/L   Collection site BRACHIAL ARTERY     Sample type VENOUS     Comment NOTIFIED PHYSICIAN    I-STAT CG4 LACTIC ACID, ED     Status: Abnormal   Collection Time    05/11/14  3:13 PM      Result Value Ref Range   Lactic Acid, Venous  7.35 (*) 0.5 - 2.2 mmol/L  OSMOLALITY     Status: Abnormal   Collection Time    05/11/14  3:55 PM      Result Value Ref Range   Osmolality 302 (*) 275 - 300 mOsm/kg  HIV ANTIBODY (ROUTINE TESTING)     Status: None   Collection Time    05/11/14  3:55 PM      Result Value Ref Range   HIV 1&2 Ab, 4th Generation NONREACTIVE  NONREACTIVE  LACTIC ACID, PLASMA     Status: Abnormal   Collection Time    05/11/14  3:56 PM      Result Value Ref Range   Lactic Acid, Venous 5.3 (*) 0.5 - 2.2 mmol/L  HEPATIC FUNCTION PANEL     Status: Abnormal   Collection Time    05/11/14  3:56 PM      Result Value Ref Range   Total Protein 7.9  6.0 - 8.3 g/dL   Albumin 4.0  3.5 - 5.2 g/dL   AST 42 (*) 0 - 37 U/L   ALT 25  0 - 53 U/L   Alkaline Phosphatase 60  39 - 117 U/L   Total Bilirubin 0.5  0.3 - 1.2 mg/dL   Bilirubin, Direct <1.6  0.0 - 0.3 mg/dL   Indirect Bilirubin NOT CALCULATED  0.3 - 0.9 mg/dL  CK      Status: Abnormal   Collection Time    05/11/14  3:56 PM      Result Value Ref Range   Total CK 1185 (*) 7 - 232 U/L  I-STAT ARTERIAL BLOOD GAS, ED     Status: Abnormal   Collection Time    05/11/14  4:03 PM      Result Value Ref Range   pH, Arterial 7.285 (*) 7.350 - 7.450   pCO2 arterial 33.8 (*) 35.0 - 45.0 mmHg   pO2, Arterial 61.0 (*) 80.0 - 100.0 mmHg   Bicarbonate 16.1 (*) 20.0 - 24.0 mEq/L   TCO2 17  0 - 100 mmol/L   O2 Saturation 89.0     Acid-base deficit 10.0 (*) 0.0 - 2.0 mmol/L   Patient temperature 98.6 F     Collection site RADIAL, ALLEN'S TEST ACCEPTABLE     Drawn by RT     Sample type ARTERIAL    MRSA PCR SCREENING     Status: None   Collection Time    05/11/14  8:45 PM      Result Value Ref Range   MRSA by PCR NEGATIVE  NEGATIVE  URINALYSIS, ROUTINE W REFLEX MICROSCOPIC     Status: Abnormal   Collection Time    05/11/14  9:33 PM      Result Value Ref Range   Color, Urine YELLOW  YELLOW   APPearance CLEAR  CLEAR   Specific Gravity, Urine 1.019  1.005 - 1.030   pH 6.0  5.0 - 8.0   Glucose, UA NEGATIVE  NEGATIVE mg/dL   Hgb urine dipstick NEGATIVE  NEGATIVE   Bilirubin Urine NEGATIVE  NEGATIVE   Ketones, ur 15 (*) NEGATIVE mg/dL   Protein, ur NEGATIVE  NEGATIVE mg/dL   Urobilinogen, UA 0.2  0.0 - 1.0 mg/dL   Nitrite NEGATIVE  NEGATIVE   Leukocytes, UA NEGATIVE  NEGATIVE  COMPREHENSIVE METABOLIC PANEL     Status: Abnormal   Collection Time    05/12/14  3:46 AM      Result Value Ref Range   Sodium 138  137 -  147 mEq/L   Potassium 3.8  3.7 - 5.3 mEq/L   Chloride 101  96 - 112 mEq/L   CO2 22  19 - 32 mEq/L   Glucose, Bld 80  70 - 99 mg/dL   BUN 11  6 - 23 mg/dL   Creatinine, Ser 1.610.93  0.50 - 1.35 mg/dL   Calcium 8.7  8.4 - 09.610.5 mg/dL   Total Protein 7.0  6.0 - 8.3 g/dL   Albumin 3.5  3.5 - 5.2 g/dL   AST 44 (*) 0 - 37 U/L   ALT 22  0 - 53 U/L   Alkaline Phosphatase 55  39 - 117 U/L   Total Bilirubin 0.9  0.3 - 1.2 mg/dL   GFR calc non Af Amer >90   >90 mL/min   GFR calc Af Amer >90  >90 mL/min   Anion gap 15  5 - 15  CBC     Status: Abnormal   Collection Time    05/12/14  3:46 AM      Result Value Ref Range   WBC 5.8  4.0 - 10.5 K/uL   RBC 4.01 (*) 4.22 - 5.81 MIL/uL   Hemoglobin 12.7 (*) 13.0 - 17.0 g/dL   HCT 04.536.5 (*) 40.939.0 - 81.152.0 %   MCV 91.0  78.0 - 100.0 fL   MCH 31.7  26.0 - 34.0 pg   MCHC 34.8  30.0 - 36.0 g/dL   RDW 91.412.6  78.211.5 - 95.615.5 %   Platelets 195  150 - 400 K/uL  LACTIC ACID, PLASMA     Status: None   Collection Time    05/12/14  3:46 AM      Result Value Ref Range   Lactic Acid, Venous 0.7  0.5 - 2.2 mmol/L    Signed: Dionne AnoJulia Kadee Philyaw, MD 05/12/2014, 9:46 AM    Services Ordered on Discharge: none Equipment Ordered on Discharge: none

## 2014-05-12 NOTE — H&P (Signed)
Attending physician admission note: I personally interviewed and examined this patient and I concur with the evaluation and management plan outlined by resident physician Dr. Dionne AnoJulia Mallory.  Clinical summary: 33 year old man with known alcohol and polysubstance abuse brought to the hospital by police when he was found running around cars in traffic with paranoid delusions that he was being chased. [facility]. He has no known chronic medical or surgical illness and is on no chronic medications. At time of initial emergency room evaluation, he was found to have a positive urine drug screen for cocaine and an elevated blood alcohol level of 153,  normal less than 11. He was acidemic with pH 7.3. Initial lactic acid elevated at 7.35 repeat value 5.3. Serum osmolarity borderline increased at 302 normal up to 300. He had an anion gap of 17. Normal glucose of 98. Normal renal function BUN 7 creatinine 0.7. Transaminase elevations SGOT 182 SGPT 180 with normal bilirubin 0.2. He denied any other toxic ingestions other than beer. There were no signs for acute infection. White count 8500.  On initial exam He was alert, oriented, cooperative, mentation appeared normal. He denied any hallucinations. Blood pressure 113/52, pulse 94 regular, temperature 90.8, respirations 22, oxygen saturation 94% on room air. He had some abrasions over his knees where he fell. No focal neurologic deficits.  Treatment was initiated with hydration. He was put on alcohol withdrawal precautions.  Current exam: Blood pressure 137/81, pulse 93, temperature 98.4 F (36.9 C), temperature source Oral, resp. rate 19, SpO2 97.00%. He is alert and oriented. There are no focal neurologic deficits.  Transaminases have already improved with fall and SGOT and SGPT to 44 and 22 respectively; lactic acidosis has resolved. Lactic acid level now 0.7. Anion gap has closed: Now 15.  Impression: Acute  alcohol and cocaine toxicity with associated transient delusional and paranoid behavior and lactic acidosis now resolving rapidly.  Cephas DarbyJames Granfortuna, MD, FACP  Hematology-Oncology/Internal Medicine

## 2014-05-12 NOTE — Progress Notes (Signed)
Utilization Review completed.  

## 2014-05-12 NOTE — Progress Notes (Signed)
Subjective: [name] feels well this morning. He does affirm that someone was chasing him yesterday, but denies any current hallucinations or current sense that anyone is chasing after him. He would like to go home today.  Objective: Vital signs in last 24 hours: Filed Vitals:   05/11/14 1600 05/11/14 1822 05/12/14 0012 05/12/14 0601  BP: 137/75 148/82 141/70 137/81  Pulse: 86 90 94 93  Temp:  98 F (36.7 C) 99.1 F (37.3 C) 98.4 F (36.9 C)  TempSrc:  Oral Oral Oral  Resp: 20 20 20 19   SpO2: 100% 98% 98% 97%   Weight change:   Intake/Output Summary (Last 24 hours) at 05/12/14 0817 Last data filed at 05/12/14 0603  Gross per 24 hour  Intake      0 ml  Output   1200 ml  Net  -1200 ml   Physical Exam:  Appearance: in NAD, lying in bed in the dark with a sitter in the room. HEENT: AT/[place], slight edema OS, EOMi, unequal pupils (R 5 mm, L 7 mm), right reacts briskly, left slightly delayed, cavity in right lower molar; patient states that it does not hurt Heart: RRR, normal S1S2, no murmurs  Lungs: CTAB, no wheezing  Abdomen: BS+, soft, nontender  Musculoskeletal: surface injuries that are red but not actively bleeding on both knees. R knee wrapped with 2.5x3cm lesion and smaller, swollen lesion superior. L knee unwrapped with 3x2.5cm lesion. 1x1cm lesion on right lateral ankle, slightly swollen, and left dorsal foot. Slight tenderness to palpation in left scaphoid, but no swelling.  Neurologic: A&Ox3, CN II-XII intact, strength and sensation intact throughout  Psychiatric: malodorous, calm, cooperative, no abnormal motor movements, normal mood, normal affect, thought process clear, linear, goal-directed, patient denies any persistent hallucinations, denies any auditory hallucinations at any point. Patient affirms prior persecutory delusions (states that there was a "guy trying to get me"). No perceptional disturbances. Judgement fair. Poor insight into drug use and possible  psychosis.   Lab Results: Basic Metabolic Panel:  Recent Labs Lab 05/11/14 1310 05/12/14 0346  NA 134* 138  K 4.1 3.8  CL 93* 101  CO2 12* 22  GLUCOSE 48* 80  BUN 12 11  CREATININE 1.01 0.93  CALCIUM 9.4 8.7   Liver Function Tests:  Recent Labs Lab 05/11/14 1556 05/12/14 0346  AST 42* 44*  ALT 25 22  ALKPHOS 60 55  BILITOT 0.5 0.9  PROT 7.9 7.0  ALBUMIN 4.0 3.5   CBC:  Recent Labs Lab 05/11/14 1310 05/12/14 0346  WBC 8.5 5.8  NEUTROABS 6.6  --   HGB 14.6 12.7*  HCT 40.8 36.5*  MCV 91.3 91.0  PLT 248 195   Cardiac Enzymes:  Recent Labs Lab 05/11/14 1556  CKTOTAL 1185*   Urine Drug Screen: Drugs of Abuse     Component Value Date/Time   LABOPIA NONE DETECTED 05/11/2014 1353   COCAINSCRNUR POSITIVE* 05/11/2014 1353   LABBENZ NONE DETECTED 05/11/2014 1353   AMPHETMU NONE DETECTED 05/11/2014 1353   THCU NONE DETECTED 05/11/2014 1353   LABBARB NONE DETECTED 05/11/2014 1353    Alcohol Level:  Recent Labs Lab 05/11/14 1310  ETH 153*   Urinalysis:  Recent Labs Lab 05/11/14 2133  COLORURINE YELLOW  LABSPEC 1.019  PHURINE 6.0  GLUCOSEU NEGATIVE  HGBUR NEGATIVE  BILIRUBINUR NEGATIVE  KETONESUR 15*  PROTEINUR NEGATIVE  UROBILINOGEN 0.2  NITRITE NEGATIVE  LEUKOCYTESUR NEGATIVE   Micro Results: Recent Results (from the past 240 hour(s))  MRSA PCR SCREENING  Status: None   Collection Time    05/11/14  8:45 PM      Result Value Ref Range Status   MRSA by PCR NEGATIVE  NEGATIVE Final   Comment:            The GeneXpert MRSA Assay (FDA     approved for NASAL specimens     only), is one component of a     comprehensive MRSA colonization     surveillance program. It is not     intended to diagnose MRSA     infection nor to guide or     monitor treatment for     MRSA infections.   Studies/Results: No results found. Medications: I have reviewed the patient's current medications. Scheduled Meds: . folic acid  1 mg Oral Daily  . heparin   5,000 Units Subcutaneous 3 times per day  . Influenza vac split quadrivalent PF  0.5 mL Intramuscular Tomorrow-1000  . multivitamin with minerals  1 tablet Oral Daily  . thiamine  100 mg Oral Daily   Or  . thiamine  100 mg Intravenous Daily   Continuous Infusions: . sodium chloride 125 mL/hr at 05/12/14 0257   PRN Meds:.acetaminophen, LORazepam, LORazepam Assessment/Plan: Principal Problem:   Metabolic acidosis Active Problems:   Alcohol intoxication Mr. Skowronek is a 33 yo man with a history of polysubstance abuse who by police report experienced visual hallucinations and persecutory delusions yesterday after using alcohol and cocaine. While in the ED, his symptoms subsided, but he was found to have a high anion gap metabolic acidosis and a lactic acidosis. The abnormal labs have now resolved with gentle hydration. He has had no further hallucinations or delusions while in the hospital. He is interested in help with his alcohol cessation.  High anion gap, metabolic acidosis likely secondary to lactic acidosis: patient's ABG was initially significant for pH of 7.285 with corresponding pCO2 of 33.8 and bicarbonate of 16.1. Anion gap is 29 with elevated lactic acid of 7.35, indicating that his acidosis is likely from elevated lactate levels. While initially, all causes were considered, his lactic acid rapidly normalized to 0.7 this morning. Perhaps his intense physical activity yesterday (which was corroborated by police and by his elevated CK of 1185) and his heavy alcohol ingestion combined to cause these initial lab results. He continues to deny ingestion of any other substances and remains afebrile and without a white count. Serum osmolality 302 (slightly elevated). HIV nonreactive. No need to repeat ABG this morning. - Continue IVF fluids @ 125 mL/hr  - F/u serum osmololality   Persecutory Delusions and Visual Hallucinations: Now resolved. 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 This may have been brought on by the fact that the patient had used neither alcohol nor cocaine for several months while in [place], then used large amounts over the past 4 days.  Polysubstance Abuse: Patient has recently been incarcerated, but admits to drinking 10-15 cans of beer for the past 4 days (including this morning). Patient has noticed tremors when withdrawing from alcohol in prison, but denies any history of withdrawal seizures. CIWA was 3 this morning. - Continue CIWA protocol - SW will attempt to visit patient today with cessation counseling and outpatient resources, as patient is interested in quitting  - Will send patient with [employer] [facility] information  DVT Ppx:  - Bath heparin and SCDs  Diet: regular   Dispo: Disposition is deferred at this time, awaiting improvement  of current medical problems.  Anticipated discharge in approximately 0 day(s).   The patient does not have a current PCP (No Pcp Per Patient) and [facility] after discharge.  The patient does not have transportation limitations that hinder transportation to clinic appointments.  .Services Needed at time of discharge: Y = Yes, Blank = No PT:   OT:   RN:   Equipment:   Other:     LOS: 1 day   Dionne AnoJulia Shardea Cwynar, MD 05/12/2014, 8:17 AM

## 2014-05-12 NOTE — Progress Notes (Signed)
Patient discharge teaching given, including activity, diet, follow-up appoints, and medications. Patient verbalized understanding of all discharge instructions. IV access was d/c'd. Vitals are stable. Skin is intact except as charted in most recent assessments. Pt to be escorted out by NT, to be driven home by family. 

## 2014-05-12 NOTE — Discharge Summary (Signed)
Medicine attending and discharge note: I personally interviewed and examined this patient on the day of discharge. Discharge summary and plan accurate as recorded by resident physician Dr. Eleonore ChiquitoJulie Mallory. 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 Please see history and physical for full details. He improved rapidly with hydration and withdrawal of alcohol and drugs. He is discharged in stable condition. He is advised to [facility]. Contact information provided at time of discharge.  Francisco DarbyJames Rosaleah Person, MD, FACP  Hematology-Oncology/Internal Medicine

## 2014-09-18 ENCOUNTER — Emergency Department (HOSPITAL_COMMUNITY): Payer: Self-pay

## 2014-09-18 ENCOUNTER — Encounter (HOSPITAL_COMMUNITY): Payer: Self-pay | Admitting: *Deleted

## 2014-09-18 ENCOUNTER — Emergency Department (HOSPITAL_COMMUNITY)
Admission: EM | Admit: 2014-09-18 | Discharge: 2014-09-18 | Disposition: A | Discharge status: Home / Self Care | Payer: Self-pay | Attending: Emergency Medicine | Admitting: Emergency Medicine

## 2014-09-18 ENCOUNTER — Other Ambulatory Visit (HOSPITAL_COMMUNITY): Payer: Self-pay | Admitting: Orthopaedic Surgery

## 2014-09-18 DIAGNOSIS — Y9389 Activity, other specified: Secondary | ICD-10-CM | POA: Insufficient documentation

## 2014-09-18 DIAGNOSIS — Z8679 Personal history of other diseases of the circulatory system: Secondary | ICD-10-CM | POA: Insufficient documentation

## 2014-09-18 DIAGNOSIS — Y9289 Other specified places as the place of occurrence of the external cause: Secondary | ICD-10-CM | POA: Insufficient documentation

## 2014-09-18 DIAGNOSIS — S82402A Unspecified fracture of shaft of left fibula, initial encounter for closed fracture: Secondary | ICD-10-CM

## 2014-09-18 DIAGNOSIS — Y998 Other external cause status: Secondary | ICD-10-CM | POA: Insufficient documentation

## 2014-09-18 DIAGNOSIS — W009XXA Unspecified fall due to ice and snow, initial encounter: Secondary | ICD-10-CM

## 2014-09-18 DIAGNOSIS — S93492A Sprain of other ligament of left ankle, initial encounter: Secondary | ICD-10-CM | POA: Insufficient documentation

## 2014-09-18 DIAGNOSIS — S93422A Sprain of deltoid ligament of left ankle, initial encounter: Secondary | ICD-10-CM

## 2014-09-18 DIAGNOSIS — S82832A Other fracture of upper and lower end of left fibula, initial encounter for closed fracture: Secondary | ICD-10-CM | POA: Insufficient documentation

## 2014-09-18 DIAGNOSIS — W1830XA Fall on same level, unspecified, initial encounter: Secondary | ICD-10-CM | POA: Insufficient documentation

## 2014-09-18 DIAGNOSIS — Z720 Tobacco use: Secondary | ICD-10-CM | POA: Insufficient documentation

## 2014-09-18 MED ORDER — OXYCODONE-ACETAMINOPHEN 5-325 MG PO TABS
1.0000 | ORAL_TABLET | Freq: Once | ORAL | Status: AC
  Administered 2014-09-18: 1 via ORAL
  Filled 2014-09-18: qty 1

## 2014-09-18 MED ORDER — OXYCODONE-ACETAMINOPHEN 5-325 MG PO TABS: 1.0000 | ORAL_TABLET | Freq: Four times a day (QID) | ORAL | Status: DC | PRN

## 2014-09-18 MED ORDER — IBUPROFEN 600 MG PO TABS: 600.0000 mg | ORAL_TABLET | Freq: Four times a day (QID) | ORAL | Status: DC | PRN

## 2014-09-18 NOTE — ED Notes (Signed)
Ortho tech in. 

## 2014-09-18 NOTE — Progress Notes (Signed)
Orthopedic Tech Progress Note Patient Details:  Francisco MediciMarcus Jacobs [birth year] [id number]  Ortho Devices Type of Ortho Device: Ace wrap, Crutches, Post (short leg) splint Ortho Device/Splint Location: lle Ortho Device/Splint Interventions: Application   Shalaina Guardiola 09/18/2014, 1:26 PM

## 2014-09-18 NOTE — ED Notes (Signed)
Ortho has been called.  We are still waiting for ortho to come place splint.

## 2014-09-18 NOTE — ED Notes (Signed)
Pt states that he hurt his left ankle last night. Pt states that he slipped on the sidewalk and fell. Pt reports pain, swelling noted. Pulses present

## 2014-09-18 NOTE — ED Notes (Signed)
[facility] to Dr. Ophelia CharterYates' office.

## 2014-09-18 NOTE — ED Provider Notes (Signed)
CSN: [id number]     Arrival date & time 09/18/14  1023 History  This chart was scribed for non-physician practitioner, Junius FinnerErin O'Malley, PA-C working with Arby BarretteMarcy Pfeiffer, MD by Greggory StallionKayla Andersen, ED scribe. This patient was seen in room TR10C/TR10C and the patient's care was started at 11:15 AM.    Chief Complaint  Patient presents with  . Ankle Injury   The history is provided by the patient. No language interpreter was used.    HPI Comments: Francisco Jacobs is a 34 y.o. male who presents to the Emergency Department complaining of left ankle injury that occurred last night. Pt slipped on the sidewalk and inverted his ankle. He reports sudden onset pain with associated swelling. Pain is 10/10 at worst, minimal at rest.  Bearing weight and movements worsen pain. He has not yet taken any medications. Pt denies knee pain.   Past Medical History  Diagnosis Date  . Migraine     "don't remember the last time I had one" (05/11/2014)   Past Surgical History  Procedure Laterality Date  . No past surgeries     No family history on file. History  Substance Use Topics  . Smoking status: Current Every Day Smoker -- 0.50 packs/day for 20 years    Types: Cigarettes  . Smokeless tobacco: Never Used  . Alcohol Use: 63.0 oz/week    105 Cans of beer per week     Comment: 05/11/2014 "12-18 beers/day"    Review of Systems  Musculoskeletal: Positive for joint swelling and arthralgias.  All other systems reviewed and are negative.  Allergies  Review of patient's allergies indicates no known allergies.  Home Medications   Prior to Admission medications   Medication Sig Start Date End Date Taking? Authorizing Provider  ibuprofen (ADVIL,MOTRIN) 600 MG tablet Take 1 tablet (600 mg total) by mouth every 6 (six) hours as needed. 09/18/14   Junius FinnerErin O'Malley, PA-C  oxyCODONE-acetaminophen (PERCOCET/ROXICET) 5-325 MG per tablet Take 1-2 tablets by mouth every 6 (six) hours as needed for moderate pain or severe pain.  09/18/14   Junius FinnerErin O'Malley, PA-C   BP 134/92 mmHg  Pulse 101  Temp(Src) 97.9 F (36.6 C) (Oral)  Resp 16  SpO2 99%   Physical Exam  Constitutional: He is oriented to person, place, and time. He appears well-developed and well-nourished.  HENT:  Head: Normocephalic and atraumatic.  Eyes: EOM are normal.  Neck: Normal range of motion.  Cardiovascular: Normal rate.   Pedal pulses 2+.  Pulmonary/Chest: Effort normal.  Musculoskeletal: He exhibits edema and tenderness.  Left ankle with significant edema to medial aspect. Tenderness to medial and lateral aspects. Limited ROM due to pain. Able to wiggle five toes. No calf tenderness. Full ROM of left knee.   Neurological: He is alert and oriented to person, place, and time.  Sensation intact.  Skin: Skin is warm and dry.  Mild ecchymosis. Skin intact.  Psychiatric: He has a normal mood and affect. His behavior is normal.  Nursing note and vitals reviewed.   ED Course  Procedures (including critical care time)  DIAGNOSTIC STUDIES: Oxygen Saturation is 98% on RA, normal by my interpretation.    COORDINATION OF CARE: 11:17 AM-Advised pt of xray results. Will consult Dr. Donnald GarrePfeiffer for treatment plan. Pt is agreeable.  11:20 AM-Spoke with Dr. Donnald GarrePfeiffer. She advised orthopedic consult.   11:29 AM-Consulted with Dr. Ophelia CharterYates. He advised splint, following up in office at 1 PM today, and surgery tomorrow.   Labs Review Labs Reviewed - No  data to display  Imaging Review Dg Ankle Complete Left  09/18/2014   CLINICAL DATA:  Fall last night.  Pain and swelling  EXAM: LEFT ANKLE COMPLETE - 3+ VIEW  COMPARISON:  None.  FINDINGS: Oblique fracture distal fibular with mild displacement. Small avulsion fracture posterior malleolus. Mild widening of the medial joint space suggesting medial ligament injury. No medial fracture.  IMPRESSION: Fracture of the distal fibula. Probable medial ligament injury with widening of the medial joint space.    Electronically Signed   By: Marlan Palau M.D.   On: 09/18/2014 11:10     EKG Interpretation None      MDM   Final diagnoses:  Fall from slipping on ice, initial encounter  Fibula fracture, left, closed, initial encounter  Sprain of medial ligament of talocrural joint, left, initial encounter     I personally performed the services described in this documentation, which was scribed in my presence. The recorded information has been reviewed and is accurate.   Junius Finner, PA-C 09/18/14 1644  Arby Barrette, MD 09/20/14 2007

## 2014-09-18 NOTE — Progress Notes (Signed)
Pt denies SOB, chest pain, and being under the care of a cardiologist. Pt denies having an EKG, chest x ray, stress test, echo, and cardiac cath. Pt made aware to stop taking Aspirin, NSAID's, otc vitamins and herbal medications.

## 2014-09-18 NOTE — ED Notes (Signed)
Contacted Dr. Ophelia CharterYates office. Informed them pt would be late due to waiting on ortho tech for splinting. Office said 1:30 would be ok.

## 2014-09-18 NOTE — H&P (Signed)
Francisco MediciMarcus Jacobs is an 34 y.o. male.   Chief Complaint: left ankle pain HPI:  34 yo bm slipped and fell last night.  Brought to the ED and xrays showed distal fibula fracture syndosmosis widening, medial clear space widening and bimalleolar ankle fracture.   Presented to our office today for evaluation.   Past Medical History  Diagnosis Date  . Migraine     "don't remember the last time I had one" (05/11/2014)    Past Surgical History  Procedure Laterality Date  . No past surgeries      No family history on file. Social History:  reports that he has been smoking Cigarettes.  He has a 10 pack-year smoking history. He has never used smokeless tobacco. He reports that he drinks about 63.0 oz of alcohol per week. He reports that he uses illicit drugs (Cocaine and Marijuana) about 7 times per week.  Allergies: No Known Allergies  No prescriptions prior to admission    No results found for this or any previous visit (from the past 48 hour(s)). Dg Ankle Complete Left  09/18/2014   CLINICAL DATA:  Fall last night.  Pain and swelling  EXAM: LEFT ANKLE COMPLETE - 3+ VIEW  COMPARISON:  None.  FINDINGS: Oblique fracture distal fibular with mild displacement. Small avulsion fracture posterior malleolus. Mild widening of the medial joint space suggesting medial ligament injury. No medial fracture.  IMPRESSION: Fracture of the distal fibula. Probable medial ligament injury with widening of the medial joint space.   Electronically Signed   By: Marlan Palauharles  Clark Jacobs.D.   On: 09/18/2014 11:10    Review of Systems  Constitutional: Negative.   HENT: Negative.   Respiratory: Negative.   Cardiovascular: Negative.   Gastrointestinal: Negative.   Musculoskeletal: Positive for joint pain.  Skin: Negative.   Neurological: Negative.   Psychiatric/Behavioral: Negative.     There were no vitals taken for this visit. Physical Exam  Constitutional: He is oriented to person, place, and time. He appears  well-developed and well-nourished.  HENT:  Head: Normocephalic and atraumatic.  Eyes: EOM are normal. Pupils are equal, round, and reactive to light.  Neck: Normal range of motion.  Respiratory: Effort normal and breath sounds normal.  Musculoskeletal:  Splint intact left lower ext.  Moves toes well.  NVI.    Neurological: He is alert and oriented to person, place, and time.  Psychiatric: He has a normal mood and affect.     Assessment/Plan Left distal fibula fracture. Will schedule patient for ORIF distal fibula fracture, syndosmosis ORIF and screw fixation..  Surgical procedure along with possible risks and complications discussed.  All questions answered.  Posterior malleolus will not require fixation.   [name],JAMES Jacobs 09/18/2014, 4:52 PM

## 2014-09-19 ENCOUNTER — Encounter (HOSPITAL_COMMUNITY)
Admission: RE | Disposition: A | Discharge status: Home / Self Care | Payer: Self-pay | Source: Ambulatory Visit | Attending: Orthopaedic Surgery

## 2014-09-19 ENCOUNTER — Ambulatory Visit (HOSPITAL_COMMUNITY): Payer: Self-pay | Admitting: Anesthesiology

## 2014-09-19 ENCOUNTER — Ambulatory Visit (HOSPITAL_COMMUNITY)
Admission: RE | Admit: 2014-09-19 | Discharge: 2014-09-19 | Disposition: A | Discharge status: Home / Self Care | Payer: Self-pay | Source: Ambulatory Visit | Attending: Orthopaedic Surgery | Admitting: Orthopaedic Surgery

## 2014-09-19 ENCOUNTER — Ambulatory Visit (HOSPITAL_COMMUNITY): Payer: Self-pay

## 2014-09-19 ENCOUNTER — Encounter (HOSPITAL_COMMUNITY): Payer: Self-pay | Admitting: Certified Registered Nurse Anesthetist

## 2014-09-19 DIAGNOSIS — S9305XA Dislocation of left ankle joint, initial encounter: Secondary | ICD-10-CM | POA: Insufficient documentation

## 2014-09-19 DIAGNOSIS — Z419 Encounter for procedure for purposes other than remedying health state, unspecified: Secondary | ICD-10-CM

## 2014-09-19 DIAGNOSIS — F1410 Cocaine abuse, uncomplicated: Secondary | ICD-10-CM | POA: Insufficient documentation

## 2014-09-19 DIAGNOSIS — F17210 Nicotine dependence, cigarettes, uncomplicated: Secondary | ICD-10-CM | POA: Insufficient documentation

## 2014-09-19 DIAGNOSIS — S82842A Displaced bimalleolar fracture of left lower leg, initial encounter for closed fracture: Secondary | ICD-10-CM

## 2014-09-19 DIAGNOSIS — W1830XA Fall on same level, unspecified, initial encounter: Secondary | ICD-10-CM | POA: Insufficient documentation

## 2014-09-19 DIAGNOSIS — F1210 Cannabis abuse, uncomplicated: Secondary | ICD-10-CM | POA: Insufficient documentation

## 2014-09-19 DIAGNOSIS — Y929 Unspecified place or not applicable: Secondary | ICD-10-CM | POA: Insufficient documentation

## 2014-09-19 HISTORY — DX: Alcohol abuse, uncomplicated: F10.10

## 2014-09-19 HISTORY — PX: ORIF FIBULA FRACTURE: SHX5114

## 2014-09-19 HISTORY — DX: Unspecified asthma, uncomplicated: J45.909

## 2014-09-19 HISTORY — DX: Other fracture of unspecified lower leg, initial encounter for closed fracture: S82.899A

## 2014-09-19 LAB — CBC
HCT: 44.2 % (ref 39.0–52.0)
HEMOGLOBIN: 15.3 g/dL (ref 13.0–17.0)
MCH: 32.2 pg (ref 26.0–34.0)
MCHC: 34.6 g/dL (ref 30.0–36.0)
MCV: 93.1 fL (ref 78.0–100.0)
Platelets: 214 10*3/uL (ref 150–400)
RBC: 4.75 MIL/uL (ref 4.22–5.81)
RDW: 12.8 % (ref 11.5–15.5)
WBC: 6.7 10*3/uL (ref 4.0–10.5)

## 2014-09-19 LAB — COMPREHENSIVE METABOLIC PANEL
ALK PHOS: 64 U/L (ref 39–117)
ALT: 60 U/L — ABNORMAL HIGH (ref 0–53)
ANION GAP: 15 (ref 5–15)
AST: 66 U/L — ABNORMAL HIGH (ref 0–37)
Albumin: 4.2 g/dL (ref 3.5–5.2)
BILIRUBIN TOTAL: 1.1 mg/dL (ref 0.3–1.2)
BUN: 12 mg/dL (ref 6–23)
CO2: 19 mmol/L (ref 19–32)
Calcium: 10 mg/dL (ref 8.4–10.5)
Chloride: 104 mmol/L (ref 96–112)
Creatinine, Ser: 1.06 mg/dL (ref 0.50–1.35)
GFR calc Af Amer: >90 mL/min (ref >90)
GFR calc non Af Amer: 89 mL/min — ABNORMAL LOW (ref >90)
Glucose, Bld: 87 mg/dL (ref 70–99)
POTASSIUM: 4 mmol/L (ref 3.5–5.1)
Sodium: 138 mmol/L (ref 135–145)
Total Protein: 8.7 g/dL — ABNORMAL HIGH (ref 6.0–8.3)

## 2014-09-19 SURGERY — OPEN REDUCTION INTERNAL FIXATION (ORIF) FIBULA FRACTURE
Anesthesia: Regional | Site: Ankle | Laterality: Left

## 2014-09-19 MED ORDER — ONDANSETRON HCL 4 MG/2ML IJ SOLN
INTRAMUSCULAR | Status: DC | PRN
  Administered 2014-09-19: 4 mg via INTRAVENOUS

## 2014-09-19 MED ORDER — MIDAZOLAM HCL 5 MG/5ML IJ SOLN
INTRAMUSCULAR | Status: DC | PRN
  Administered 2014-09-19: 2 mg via INTRAVENOUS

## 2014-09-19 MED ORDER — MIDAZOLAM HCL 2 MG/2ML IJ SOLN
INTRAMUSCULAR | Status: AC
  Administered 2014-09-19: 2 mg
  Filled 2014-09-19: qty 2

## 2014-09-19 MED ORDER — FENTANYL CITRATE 0.05 MG/ML IJ SOLN
INTRAMUSCULAR | Status: AC
  Filled 2014-09-19: qty 5

## 2014-09-19 MED ORDER — FENTANYL CITRATE 0.05 MG/ML IJ SOLN
INTRAMUSCULAR | Status: DC | PRN
  Administered 2014-09-19: 50 ug via INTRAVENOUS
  Administered 2014-09-19: 100 ug via INTRAVENOUS
  Administered 2014-09-19: 50 ug via INTRAVENOUS

## 2014-09-19 MED ORDER — ARTIFICIAL TEARS OP OINT
TOPICAL_OINTMENT | OPHTHALMIC | Status: AC
  Filled 2014-09-19: qty 3.5

## 2014-09-19 MED ORDER — OXYCODONE-ACETAMINOPHEN 5-325 MG PO TABS: 1.0000 | ORAL_TABLET | Freq: Four times a day (QID) | ORAL | Status: DC | PRN

## 2014-09-19 MED ORDER — LIDOCAINE HCL (CARDIAC) 20 MG/ML IV SOLN
INTRAVENOUS | Status: AC
  Filled 2014-09-19: qty 5

## 2014-09-19 MED ORDER — OXYCODONE HCL 5 MG/5ML PO SOLN: 5.0000 mg | Freq: Once | ORAL | Status: DC | PRN

## 2014-09-19 MED ORDER — ROCURONIUM BROMIDE 50 MG/5ML IV SOLN
INTRAVENOUS | Status: AC
  Filled 2014-09-19: qty 1

## 2014-09-19 MED ORDER — FENTANYL CITRATE 0.05 MG/ML IJ SOLN
INTRAMUSCULAR | Status: AC
  Administered 2014-09-19: 100 ug
  Filled 2014-09-19: qty 2

## 2014-09-19 MED ORDER — KETOROLAC TROMETHAMINE 30 MG/ML IJ SOLN: 30.0000 mg | Freq: Once | INTRAMUSCULAR | Status: DC | PRN

## 2014-09-19 MED ORDER — HYDROMORPHONE HCL 1 MG/ML IJ SOLN: 0.2500 mg | INTRAMUSCULAR | Status: DC | PRN

## 2014-09-19 MED ORDER — 0.9 % SODIUM CHLORIDE (POUR BTL) OPTIME
TOPICAL | Status: DC | PRN
  Administered 2014-09-19: 1000 mL

## 2014-09-19 MED ORDER — BUPIVACAINE-EPINEPHRINE (PF) 0.5% -1:200000 IJ SOLN
INTRAMUSCULAR | Status: DC | PRN
  Administered 2014-09-19: 30 mL via PERINEURAL

## 2014-09-19 MED ORDER — PROPOFOL 10 MG/ML IV BOLUS
INTRAVENOUS | Status: DC | PRN
  Administered 2014-09-19: 250 mg via INTRAVENOUS

## 2014-09-19 MED ORDER — METHOCARBAMOL 500 MG PO TABS: 500.0000 mg | ORAL_TABLET | Freq: Four times a day (QID) | ORAL | Status: DC

## 2014-09-19 MED ORDER — OXYCODONE HCL 5 MG PO TABS: 5.0000 mg | ORAL_TABLET | Freq: Once | ORAL | Status: DC | PRN

## 2014-09-19 MED ORDER — PROMETHAZINE HCL 25 MG/ML IJ SOLN
6.2500 mg | INTRAMUSCULAR | Status: DC | PRN
Start: 2014-09-19 — End: 2014-09-19

## 2014-09-19 MED ORDER — LACTATED RINGERS IV SOLN
INTRAVENOUS | Status: DC | PRN
  Administered 2014-09-19 (×2): via INTRAVENOUS

## 2014-09-19 MED ORDER — CEFAZOLIN SODIUM-DEXTROSE 2-3 GM-% IV SOLR
2.0000 g | INTRAVENOUS | Status: AC
  Administered 2014-09-19: 2 g via INTRAVENOUS
  Filled 2014-09-19: qty 50

## 2014-09-19 MED ORDER — ARTIFICIAL TEARS OP OINT
TOPICAL_OINTMENT | OPHTHALMIC | Status: DC | PRN
  Administered 2014-09-19: 1 via OPHTHALMIC

## 2014-09-19 MED ORDER — PROPOFOL 10 MG/ML IV BOLUS
INTRAVENOUS | Status: AC
  Filled 2014-09-19: qty 20

## 2014-09-19 MED ORDER — BUPIVACAINE HCL (PF) 0.25 % IJ SOLN
INTRAMUSCULAR | Status: AC
  Filled 2014-09-19: qty 30

## 2014-09-19 MED ORDER — MIDAZOLAM HCL 2 MG/2ML IJ SOLN
INTRAMUSCULAR | Status: AC
  Filled 2014-09-19: qty 2

## 2014-09-19 MED ORDER — ONDANSETRON HCL 4 MG/2ML IJ SOLN
INTRAMUSCULAR | Status: AC
  Filled 2014-09-19: qty 2

## 2014-09-19 SURGICAL SUPPLY — 58 items
BANDAGE ELASTIC 4 VELCRO ST LF (GAUZE/BANDAGES/DRESSINGS) ×3 IMPLANT
BANDAGE ELASTIC 6 VELCRO ST LF (GAUZE/BANDAGES/DRESSINGS) ×3 IMPLANT
BANDAGE ESMARK 6X9 LF (GAUZE/BANDAGES/DRESSINGS) IMPLANT
BIT DRILL 2.5X2.75 QC CALB (BIT) ×3 IMPLANT
BNDG ESMARK 6X9 LF (GAUZE/BANDAGES/DRESSINGS)
CANISTER SUCTION 2500CC (MISCELLANEOUS) ×3 IMPLANT
COVER MAYO STAND STRL (DRAPES) ×3 IMPLANT
COVER SURGICAL LIGHT HANDLE (MISCELLANEOUS) ×3 IMPLANT
CUFF TOURNIQUET SINGLE 34IN LL (TOURNIQUET CUFF) IMPLANT
CUFF TOURNIQUET SINGLE 44IN (TOURNIQUET CUFF) IMPLANT
DRAPE C-ARM 42X72 X-RAY (DRAPES) ×3 IMPLANT
DRAPE INCISE IOBAN 66X45 STRL (DRAPES) ×3 IMPLANT
DRAPE PROXIMA HALF (DRAPES) ×3 IMPLANT
DRAPE U-SHAPE 47X51 STRL (DRAPES) ×3 IMPLANT
DRSG PAD ABDOMINAL 8X10 ST (GAUZE/BANDAGES/DRESSINGS) ×3 IMPLANT
DURAPREP 26ML APPLICATOR (WOUND CARE) ×3 IMPLANT
ELECT REM PT RETURN 9FT ADLT (ELECTROSURGICAL) ×3
ELECTRODE REM PT RTRN 9FT ADLT (ELECTROSURGICAL) ×1 IMPLANT
GAUZE SPONGE 4X4 12PLY STRL (GAUZE/BANDAGES/DRESSINGS) ×3 IMPLANT
GAUZE XEROFORM 5X9 LF (GAUZE/BANDAGES/DRESSINGS) ×3 IMPLANT
GLOVE BIOGEL PI IND STRL 7.5 (GLOVE) ×1 IMPLANT
GLOVE BIOGEL PI IND STRL 8 (GLOVE) ×1 IMPLANT
GLOVE BIOGEL PI INDICATOR 7.5 (GLOVE) ×2
GLOVE BIOGEL PI INDICATOR 8 (GLOVE) ×2
GLOVE ECLIPSE 7.0 STRL STRAW (GLOVE) ×3 IMPLANT
GLOVE ORTHO TXT STRL SZ7.5 (GLOVE) ×3 IMPLANT
GOWN STRL REUS W/ TWL LRG LVL3 (GOWN DISPOSABLE) ×2 IMPLANT
GOWN STRL REUS W/ TWL XL LVL3 (GOWN DISPOSABLE) ×1 IMPLANT
GOWN STRL REUS W/TWL LRG LVL3 (GOWN DISPOSABLE) ×4
GOWN STRL REUS W/TWL XL LVL3 (GOWN DISPOSABLE) ×2
KIT BASIN OR (CUSTOM PROCEDURE TRAY) ×3 IMPLANT
KIT ROOM TURNOVER OR (KITS) ×3 IMPLANT
MANIFOLD NEPTUNE II (INSTRUMENTS) IMPLANT
NS IRRIG 1000ML POUR BTL (IV SOLUTION) ×3 IMPLANT
PACK ORTHO EXTREMITY (CUSTOM PROCEDURE TRAY) ×3 IMPLANT
PAD ARMBOARD 7.5X6 YLW CONV (MISCELLANEOUS) ×3 IMPLANT
PAD CAST 4YDX4 CTTN HI CHSV (CAST SUPPLIES) ×2 IMPLANT
PADDING CAST ABS 4INX4YD NS (CAST SUPPLIES) ×2
PADDING CAST ABS COTTON 4X4 ST (CAST SUPPLIES) ×1 IMPLANT
PADDING CAST COTTON 4X4 STRL (CAST SUPPLIES) ×4
PADDING CAST COTTON 6X4 STRL (CAST SUPPLIES) ×3 IMPLANT
PLATE ACE 100DEG 8HOLE (Plate) ×3 IMPLANT
SCREW CORTICAL 3.5MM 14MM (Screw) ×3 IMPLANT
SCREW NLOCK CANC 4X60 (Screw) ×3 IMPLANT
SCREW NLOCK CANC HEX 4X14 (Screw) ×18 IMPLANT
SPLINT FIBERGLASS 4X30 (CAST SUPPLIES) ×3 IMPLANT
SPONGE LAP 18X18 X RAY DECT (DISPOSABLE) IMPLANT
STAPLER VISISTAT 35W (STAPLE) IMPLANT
SUCTION FRAZIER TIP 10 FR DISP (SUCTIONS) ×3 IMPLANT
SUT ETHILON 3 0 PS 1 (SUTURE) IMPLANT
SUT VIC AB 2-0 CT1 27 (SUTURE) ×4
SUT VIC AB 2-0 CT1 TAPERPNT 27 (SUTURE) ×2 IMPLANT
TOWEL OR 17X24 6PK STRL BLUE (TOWEL DISPOSABLE) ×3 IMPLANT
TOWEL OR 17X26 10 PK STRL BLUE (TOWEL DISPOSABLE) ×3 IMPLANT
TUBE CONNECTING 12'X1/4 (SUCTIONS) ×1
TUBE CONNECTING 12X1/4 (SUCTIONS) ×2 IMPLANT
WATER STERILE IRR 1000ML POUR (IV SOLUTION) IMPLANT
YANKAUER SUCT BULB TIP NO VENT (SUCTIONS) ×3 IMPLANT

## 2014-09-19 NOTE — Anesthesia Preprocedure Evaluation (Addendum)
Anesthesia Evaluation  Patient identified by MRN, date of birth, ID band Patient awake    Reviewed: Allergy & Precautions, NPO status , Patient's Chart, lab work & pertinent test results  Airway Mallampati: III  TM Distance: >3 FB Neck ROM: Full    Dental  (+) Dental Advisory Given, Teeth Intact   Pulmonary asthma , Current Smoker,  breath sounds clear to auscultation        Cardiovascular Rhythm:Regular Rate:Normal     Neuro/Psych  Headaches,    GI/Hepatic (+)     substance abuse  alcohol use, cocaine use and marijuana use,   Endo/Other  Morbid obesity  Renal/GU      Musculoskeletal   Abdominal   Peds  Hematology   Anesthesia Other Findings   Reproductive/Obstetrics                           Anesthesia Physical Anesthesia Plan  ASA: II  Anesthesia Plan: General and Regional   Post-op Pain Management:    Induction: Intravenous  Airway Management Planned: LMA  Additional Equipment:   Intra-op Plan:   Post-operative Plan: Extubation in OR  Informed Consent: I have reviewed the patients History and Physical, chart, labs and discussed the procedure including the risks, benefits and alternatives for the proposed anesthesia with the patient or authorized representative who has indicated his/her understanding and acceptance.   Dental advisory given  Plan Discussed with: CRNA  Anesthesia Plan Comments:         Anesthesia Quick Evaluation

## 2014-09-19 NOTE — Transfer of Care (Signed)
Immediate Anesthesia Transfer of Care Note  Patient: Francisco Jacobs  Procedure(s) Performed: Procedure(s): OPEN REDUCTION INTERNAL FIXATION (ORIF) FIBULA FRACTURE, Open Reduction Internal Fixation Syndesmosis (Left)  Patient Location: PACU  Anesthesia Type:General  Level of Consciousness: awake, alert  and oriented  Airway & Oxygen Therapy: Patient Spontanous Breathing  Post-op Assessment: Report given to RN  Post vital signs: Reviewed and stable  Last Vitals:  Filed Vitals:   09/19/14 1620  BP:   Pulse:   Temp: 36.5 C  Resp:     Complications: No apparent anesthesia complications

## 2014-09-19 NOTE — Interval H&P Note (Signed)
History and Physical Interval Note:  09/19/2014 12:06 PM  Francisco MediciMarcus Jacobs  has presented today for surgery, with the diagnosis of Left Fibula Fracture, Syndesmosis Rupture  The various methods of treatment have been discussed with the patient and family. After consideration of risks, benefits and other options for treatment, the patient has consented to  Procedure(s): OPEN REDUCTION INTERNAL FIXATION (ORIF) FIBULA FRACTURE, Open Reduction Internal Fixation Syndesmosis (Left) as a surgical intervention .  The patient's history has been reviewed, patient examined, no change in status, stable for surgery.  I have reviewed the patient's chart and labs.  Questions were answered to the patient's satisfaction.     Jovontae Banko C

## 2014-09-19 NOTE — Discharge Instructions (Signed)
°What to eat: ° °For your first meals, you should eat lightly; only small meals initially.  If you do not have nausea, you may eat larger meals.  Avoid spicy, greasy and heavy food.   ° °General Anesthesia, Adult, Care After  °Refer to this sheet in the next few weeks. These instructions provide you with information on caring for yourself after your procedure. Your health care provider may also give you more specific instructions. Your treatment has been planned according to current medical practices, but problems sometimes occur. Call your health care provider if you have any problems or questions after your procedure.  °WHAT TO EXPECT AFTER THE PROCEDURE  °After the procedure, it is typical to experience:  °Sleepiness.  °Nausea and vomiting. °HOME CARE INSTRUCTIONS  °For the first 24 hours after general anesthesia:  °Have a responsible person with you.  °Do not drive a car. If you are alone, do not take public transportation.  °Do not drink alcohol.  °Do not take medicine that has not been prescribed by your health care provider.  °Do not sign important papers or make important decisions.  °You may resume a normal diet and activities as directed by your health care provider.  °Change bandages (dressings) as directed.  °If you have questions or problems that seem related to general anesthesia, call the hospital and ask for the anesthetist or anesthesiologist on call. °SEEK MEDICAL CARE IF:  °You have nausea and vomiting that continue the day after anesthesia.  °You develop a rash. °SEEK IMMEDIATE MEDICAL CARE IF:  °You have difficulty breathing.  °You have chest pain.  °You have any allergic problems. °Document Released: 10/26/2000 Document Revised: 03/22/2013 Document Reviewed: 02/02/2013  °ExitCare® Patient Information ©2014 [employer].  ° °Sore Throat  ° ° °A sore throat is a painful, burning, sore, or scratchy feeling of the throat. There may be pain or tenderness when swallowing or talking. You may have  other symptoms with a sore throat. These include coughing, sneezing, fever, or a swollen neck. A sore throat is often the first sign of another sickness. These sicknesses may include a cold, flu, strep throat, or an infection called mono. Most sore throats go away without medical treatment.  °HOME CARE  °Only take medicine as told by your doctor.  °Drink enough fluids to keep your pee (urine) clear or pale yellow.  °Rest as needed.  °Try using throat sprays, lozenges, or suck on hard candy (if older than 4 years or as told).  °Sip warm liquids, such as broth, herbal tea, or warm water with honey. Try sucking on frozen ice pops or drinking cold liquids.  °Rinse the mouth (gargle) with salt water. Mix 1 teaspoon salt with 8 ounces of water.  °Do not smoke. Avoid being around others when they are smoking.  °Put a humidifier in your bedroom at night to moisten the air. You can also turn on a hot shower and sit in the bathroom for 5-10 minutes. Be sure the bathroom door is closed. °GET HELP RIGHT AWAY IF:  °You have trouble breathing.  °You cannot swallow fluids, soft foods, or your spit (saliva).  °You have more puffiness (swelling) in the throat.  °Your sore throat does not get better in 7 days.  °You feel sick to your stomach (nauseous) and throw up (vomit).  °You have a fever or lasting symptoms for more than 2-3 days.  °You have a fever and your symptoms suddenly get worse. °MAKE SURE YOU:  °Understand these   instructions.  °Will watch your condition.  °Will get help right away if you are not doing well or get worse. °Document Released: 04/28/2008 Document Revised: 04/13/2012 Document Reviewed: 03/27/2012  °ExitCare® Patient Information ©2015 [employer]. This information is not intended to replace advice given to you by your health care provider. Make sure you discuss any questions you have with your health care provider.  ° ° ° °

## 2014-09-19 NOTE — Anesthesia Postprocedure Evaluation (Signed)
  Anesthesia Post-op Note  Patient: Francisco MediciMarcus Rodman  Procedure(s) Performed: Procedure(s): OPEN REDUCTION INTERNAL FIXATION (ORIF) FIBULA FRACTURE, Open Reduction Internal Fixation Syndesmosis (Left)  Patient Location: PACU  Anesthesia Type:GA combined with regional for post-op pain  Level of Consciousness: awake and alert   Airway and Oxygen Therapy: Patient Spontanous Breathing  Post-op Pain: 2 /10  Post-op Assessment: Post-op Vital signs reviewed  Post-op Vital Signs: stable  Last Vitals:  Filed Vitals:   09/19/14 1630  BP:   Pulse: 93  Temp:   Resp: 21    Complications: No apparent anesthesia complications

## 2014-09-19 NOTE — Anesthesia Procedure Notes (Addendum)
Anesthesia Regional Block:  Popliteal block  Pre-Anesthetic Checklist: ,, timeout performed, Correct Patient, Correct Site, Correct Laterality, Correct Procedure, Correct Position, site marked, Risks and benefits discussed,  Surgical consent,  Pre-op evaluation,  At surgeon's request and post-op pain management  Laterality: Left  Prep: chloraprep       Needles:  Injection technique: Single-shot  Needle Type: Echogenic Stimulator Needle     Needle Length: 9cm 9 cm Needle Gauge: 21 and 21 G    Additional Needles:  Procedures: ultrasound guided (picture in chart) and nerve stimulator Popliteal block  Nerve Stimulator or Paresthesia:  Response: tibial, 0.5 mA,  Response: peroneal, 0.5 mA,   Additional Responses:   Narrative:  Start time: 09/19/2014 1:50 PM End time: 09/19/2014 2:00 PM Injection made incrementally with aspirations every 5 mL.  Performed by: Personally  Anesthesiologist: Marcene DuosFITZGERALD, ROBERT E  Additional Notes: Risks and benefits discussed with pt. Pt tolerated procedure well with no immediate complications.   Procedure Name: LMA Insertion Date/Time: 09/19/2014 2:49 PM Performed by: Jefm MilesENNIE, Sherian Valenza E Pre-anesthesia Checklist: Patient identified, Emergency Drugs available, Suction available, Patient being monitored and Timeout performed Patient Re-evaluated:Patient Re-evaluated prior to inductionOxygen Delivery Method: Circle system utilized Preoxygenation: Pre-oxygenation with 100% oxygen Intubation Type: IV induction Ventilation: Mask ventilation without difficulty LMA: LMA inserted LMA Size: 5.0 Number of attempts: 1 Placement Confirmation: positive ETCO2 and breath sounds checked- equal and bilateral Tube secured with: Tape Dental Injury: Teeth and Oropharynx as per pre-operative assessment

## 2014-09-20 ENCOUNTER — Encounter (HOSPITAL_COMMUNITY): Payer: Self-pay | Admitting: Orthopaedic Surgery

## 2014-09-20 NOTE — Op Note (Signed)
NAME:  Francisco Jacobs, Francisco Jacobs                ACCOUNT NO.:  [id number]  MEDICAL RECORD NO.:  [id number]  LOCATION:  [facility]                         FACILITY:  [facility]  PHYSICIAN:  Mark C. Ophelia CharterYates, M.D.    DATE OF BIRTH:  [birth year]  DATE OF PROCEDURE:  09/19/2014 DATE OF DISCHARGE:  09/19/2014                              OPERATIVE REPORT   PREOPERATIVE DIAGNOSIS:  Bimalleolar ankle fracture with syndesmotic disruption, left ankle.  POSTOPERATIVE DIAGNOSIS:  Bimalleolar ankle fracture with syndesmotic disruption, left ankle.  PROCEDURE:  Open reduction and internal fixation of lateral malleolus with open reduction and internal fixation syndesmosis and syndesmotic screw placement.  SURGEON:  Mark C. Ophelia CharterYates, M.D.  TOURNIQUET TIME:  Less than 40 minutes.  DRAINS:  None.  ANESTHESIA:  General plus preoperative popliteal block.  INDICATION:  This 34 year old male slipped and fell with his foot underneath him suffering medial deltoid ligament rupture, posterior malleolar fracture less than 1/5 of the joint, and fibular fracture 7 to 8 cm above the joint space with widening of the syndesmosis and shifting of the mortise.  The patient is brought in for stabilization of the unstable bimalleolar ankle fracture.  DESCRIPTION OF PROCEDURE:  After informed consent, Ancef prophylaxis prepping with DuraPrep, proximal thigh tourniquet had been applied. Usual extremity sheets and drapes, stockinette was applied, sterile skin marker.  Time-out procedure completed.  Leg was wrapped in Esmarch, tourniquet inflated.  Incision was made laterally over the fibula mid position over the palpable fracture where it was unstable.  The patient's talus had shifted laterally with displacement of the fracture and subperiosteal dissection on the fibula was performed, reduced, held with a clamp.  C-arm was brought in and confirmed reduction with syndesmosis, 7-hole plate was selected.  Distally cancellous screw was used,  this was about 3 mm or 2 mm above the joint, second __________ selected was syndesmotic screw and the plate was placed slightly more posterior so that syndesmotic screw could be placed across and __________ bicortically in the tibia.  Approximately 14-mm bicortical screws were placed filling the top 4 screws.  Syndesmotic screw was then placed drilling through all 4 cortices and then using a cancellous lag screw, which was somewhat difficult to get started since it was drilled with a small drill bit, the lag screw caught the opposite corner, poked through about 1 mm and with tension applied squeezed in medial lateral malleolus by hand, the syndesmosis was tightened down, checked under fluoroscopy.  This was performed with ankle in dorsiflexion, and there was good reduction of the ankle __________ of the syndesmosis.  Medial clear space looked good and syndesmosis was tightened down.  Final screw hole was filled so that all 7 holes were filled.  Only a single syndesmotic screw was required.  Irrigation with saline solution and tourniquet deflation, closure with 2-0 Vicryl.  Skin staple closure, postop dressing, and short-leg splint was applied.  The patient had good block and was able to be discharged home.  Office followup in 1 week.     Mark C. Ophelia CharterYates, M.D.     MCY/MEDQ  D:  09/19/2014  T:  09/20/2014  Job:  [id number]

## 2014-09-26 NOTE — Addendum Note (Signed)
Addendum  created 09/26/14 [id number] by Sharee Holstererry Jay Haskew, MD   Modules edited: Anesthesia Attestations

## 2016-04-06 ENCOUNTER — Encounter (HOSPITAL_COMMUNITY): Payer: Self-pay | Admitting: Emergency Medicine

## 2016-04-06 ENCOUNTER — Emergency Department (HOSPITAL_COMMUNITY)
Admission: EM | Admit: 2016-04-06 | Discharge: 2016-04-07 | Disposition: A | Discharge status: Home / Self Care | Payer: PRIVATE HEALTH INSURANCE | Attending: Emergency Medicine | Admitting: Emergency Medicine

## 2016-04-06 DIAGNOSIS — J45909 Unspecified asthma, uncomplicated: Secondary | ICD-10-CM | POA: Insufficient documentation

## 2016-04-06 DIAGNOSIS — F17210 Nicotine dependence, cigarettes, uncomplicated: Secondary | ICD-10-CM | POA: Insufficient documentation

## 2016-04-06 DIAGNOSIS — F29 Unspecified psychosis not due to a substance or known physiological condition: Secondary | ICD-10-CM

## 2016-04-06 DIAGNOSIS — F14151 Cocaine abuse with cocaine-induced psychotic disorder with hallucinations: Secondary | ICD-10-CM | POA: Diagnosis present

## 2016-04-06 DIAGNOSIS — F10230 Alcohol dependence with withdrawal, uncomplicated: Secondary | ICD-10-CM | POA: Diagnosis present

## 2016-04-06 LAB — COMPREHENSIVE METABOLIC PANEL
ALBUMIN: 4.5 g/dL (ref 3.5–5.0)
ALK PHOS: 67 U/L (ref 38–126)
ALT: 29 U/L (ref 17–63)
ANION GAP: 15 (ref 5–15)
AST: 43 U/L — ABNORMAL HIGH (ref 15–41)
BILIRUBIN TOTAL: 0.6 mg/dL (ref 0.3–1.2)
BUN: 7 mg/dL (ref 6–20)
CO2: 18 mmol/L — ABNORMAL LOW (ref 22–32)
Calcium: 9.2 mg/dL (ref 8.9–10.3)
Chloride: 104 mmol/L (ref 101–111)
Creatinine, Ser: 1.01 mg/dL (ref 0.61–1.24)
GFR calc Af Amer: >60 mL/min (ref >60)
GFR calc non Af Amer: >60 mL/min (ref >60)
Glucose, Bld: 79 mg/dL (ref 65–99)
Potassium: 3.4 mmol/L — ABNORMAL LOW (ref 3.5–5.1)
Sodium: 137 mmol/L (ref 135–145)
TOTAL PROTEIN: 8.3 g/dL — AB (ref 6.5–8.1)

## 2016-04-06 LAB — CBC WITH DIFFERENTIAL/PLATELET
BASOS PCT: 0 %
Basophils Absolute: 0 10*3/uL (ref 0.0–0.1)
Eosinophils Absolute: 0.1 10*3/uL (ref 0.0–0.7)
Eosinophils Relative: 1 %
HCT: 41.3 % (ref 39.0–52.0)
Hemoglobin: 14.3 g/dL (ref 13.0–17.0)
Lymphocytes Relative: 18 %
Lymphs Abs: 1.7 10*3/uL (ref 0.7–4.0)
MCH: 30.7 pg (ref 26.0–34.0)
MCHC: 34.6 g/dL (ref 30.0–36.0)
MCV: 88.6 fL (ref 78.0–100.0)
MONOS PCT: 7 %
Monocytes Absolute: 0.7 10*3/uL (ref 0.1–1.0)
NEUTROS PCT: 74 %
Neutro Abs: 7.1 10*3/uL (ref 1.7–7.7)
Platelets: 251 10*3/uL (ref 150–400)
RBC: 4.66 MIL/uL (ref 4.22–5.81)
RDW: 14.8 % (ref 11.5–15.5)
WBC: 9.6 10*3/uL (ref 4.0–10.5)

## 2016-04-06 LAB — ETHANOL: Alcohol, Ethyl (B): 121 mg/dL — ABNORMAL HIGH (ref <5)

## 2016-04-06 MED ORDER — ZIPRASIDONE MESYLATE 20 MG IM SOLR
20.0000 mg | Freq: Once | INTRAMUSCULAR | Status: AC
  Administered 2016-04-06: 20 mg via INTRAMUSCULAR
  Filled 2016-04-06: qty 20

## 2016-04-06 MED ORDER — VITAMIN B-1 100 MG PO TABS
100.0000 mg | ORAL_TABLET | Freq: Every day | ORAL | Status: DC
  Administered 2016-04-06 – 2016-04-07 (×2): 100 mg via ORAL
  Filled 2016-04-06 (×2): qty 1

## 2016-04-06 MED ORDER — LORAZEPAM 1 MG PO TABS: 1.0000 mg | ORAL_TABLET | Freq: Once | ORAL | Status: DC

## 2016-04-06 MED ORDER — LORAZEPAM 2 MG/ML IJ SOLN: 1.0000 mg | Freq: Four times a day (QID) | INTRAMUSCULAR | Status: DC | PRN

## 2016-04-06 MED ORDER — LORAZEPAM 1 MG PO TABS
0.0000 mg | ORAL_TABLET | Freq: Four times a day (QID) | ORAL | Status: DC
  Administered 2016-04-06: 1 mg via ORAL

## 2016-04-06 MED ORDER — FOLIC ACID 1 MG PO TABS
1.0000 mg | ORAL_TABLET | Freq: Every day | ORAL | Status: DC
  Administered 2016-04-06 – 2016-04-07 (×2): 1 mg via ORAL
  Filled 2016-04-06 (×2): qty 1

## 2016-04-06 MED ORDER — THIAMINE HCL 100 MG/ML IJ SOLN: 100.0000 mg | Freq: Every day | INTRAMUSCULAR | Status: DC

## 2016-04-06 MED ORDER — LORAZEPAM 1 MG PO TABS
1.0000 mg | ORAL_TABLET | Freq: Four times a day (QID) | ORAL | Status: DC | PRN
  Filled 2016-04-06: qty 1

## 2016-04-06 MED ORDER — LORAZEPAM 1 MG PO TABS: 0.0000 mg | ORAL_TABLET | Freq: Two times a day (BID) | ORAL | Status: DC

## 2016-04-06 MED ORDER — ADULT MULTIVITAMIN W/MINERALS CH
1.0000 | ORAL_TABLET | Freq: Every day | ORAL | Status: DC
  Administered 2016-04-06 – 2016-04-07 (×2): 1 via ORAL
  Filled 2016-04-06 (×2): qty 1

## 2016-04-06 MED ORDER — STERILE WATER FOR INJECTION IJ SOLN
INTRAMUSCULAR | Status: AC
  Administered 2016-04-06: 1.2 mL
  Filled 2016-04-06: qty 10

## 2016-04-06 NOTE — ED Notes (Signed)
[facility]. Pt given dressed into scrubs and wand ed by security prior to transport. Belongings to be transferred with pt and [facility].
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
## 2016-04-06 NOTE — ED Notes (Signed)
Pt sleeping. Respiration regular and unlabored. 15 mins checks for safety continues

## 2016-04-06 NOTE — ED Notes (Signed)
Patient in bed sleeping. Respiration regular and unlabored. 15 min observation for safety
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
## 2016-04-06 NOTE — ED Notes (Addendum)
Unable to answer screening questions related to pt resistant to answering questions appropriately.   KNOTT MD AT BEDSIDE.

## 2016-04-06 NOTE — ED Notes (Signed)
Pt non compliant.  Pt is IVC'd but told me that I am not going to stick him.  Triage RN aware and will let me know when pt calms down.
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
## 2016-04-06 NOTE — ED Notes (Signed)
Pt currently uncooperative with ED staff and [employer] that brought pt in.  [employer] given scrubs and urine cup for pt to change and give urine.  [employer] and security currently at room on stand by and talking to pt regarding process.

## 2016-04-07 DIAGNOSIS — F14151 Cocaine abuse with cocaine-induced psychotic disorder with hallucinations: Secondary | ICD-10-CM | POA: Diagnosis not present

## 2016-04-07 DIAGNOSIS — F10230 Alcohol dependence with withdrawal, uncomplicated: Secondary | ICD-10-CM | POA: Diagnosis present

## 2016-04-07 LAB — RAPID URINE DRUG SCREEN, HOSP PERFORMED
Amphetamines: NOT DETECTED
BARBITURATES: NOT DETECTED
BENZODIAZEPINES: NOT DETECTED
Cocaine: POSITIVE — AB
Opiates: NOT DETECTED
Tetrahydrocannabinol: POSITIVE — AB

## 2016-04-07 NOTE — Progress Notes (Signed)
CM spoke with pt who confirms uninsured [place] resident with no pcp.  CM discussed and provided written information to assist pt with determining choice for uninsured accepting pcps, discussed the importance of pcp vs EDP services for f/u care, www.needymeds.org, www.goodrx.com, discounted pharmacies and other [place] resources such as [facility] , [facility], affordable care act, financial assistance, uninsured dental services, [place] med assist, DSS and  health department  Reviewed resources for [place] uninsured accepting pcps like [facility], [facility] [address], [facility], [facility], local urgent care centers, [facility], [facility], general medical clinics, [facility], [facility] plus others, medication resources, [facility] out patient pharmacies and housing Pt voiced understanding and appreciation of resources provided   Provided [facility] contact information Pt agreed to a referral Cm completed referral Pt to be contact by [facility] clinical liaison  

## 2016-04-07 NOTE — ED Notes (Signed)
Patient discharged to home.  Left the unit ambulatory with all belongings and AVS.  He denies any thoughts of harm to self or others.  He was given resources for follow up if he decides to avail himself.  He was given a bus pass and escorted to the lobby.

## 2016-04-07 NOTE — BH Assessment (Signed)
[facility] Assessment Progress Note  Per Thedore MinsMojeed Akintayo, MD, this pt does not require psychiatric hospitalization at this time.  Pt present under IVC, which Dr Jannifer FranklinAkintayo has rescinded.  [facility]  This has been included in pt's discharge instructions.  Pt's nurse, Dawnaly, has been notified.  Doylene Canninghomas Lupe Handley, MA Triage Specialist [phone]
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
## 2016-04-07 NOTE — Discharge Instructions (Signed)
For your ongoing mental health needs, you are advised to follow up with [facility].  New and returning patients are seen at their walk-in clinic.  Walk-in hours are Monday - Friday from 8:00 am - 3:00 pm.  Walk-in patients are seen on a first come, first served basis.  Try to arrive as early as possible for he best chance of being seen the same day: ° °     [facility] °     [address] °     [place], [place] [address] °     [phone] °

## 2016-04-07 NOTE — [facility] Suicide Risk Assessment (Signed)
Suicide Risk Assessment  Discharge Assessment   [facility]   Principal Problem: Cocaine abuse with cocaine-induced psychotic disorder [facility](HCC) Discharge Diagnoses:  Patient Active Problem List   Diagnosis Date Noted  . Alcohol dependence with uncomplicated withdrawal ([facility]) [F10.230] 04/07/2016    Priority: High  . Cocaine abuse with cocaine-induced psychotic disorder [facility](HCC) [F14.159] 04/07/2016    Priority: High  . Bimalleolar fracture of left ankle [S82.842A] 09/19/2014  . Polysubstance abuse [F19.10] 05/12/2014  . 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 Alcohol intoxication (HCC) [F10.129] 05/12/2014  . Metabolic acidosis [E87.2] 05/11/2014  . Severe alcohol use disorder (HCC) [F10.20] 01/08/2014    Total Time spent with patient: 45 minutes  Musculoskeletal: Strength & Muscle Tone: within normal limits Gait & Station: normal Patient leans: N/A  Psychiatric Specialty Exam: Physical Exam  Constitutional: He is oriented to person, place, and time. He appears well-developed and well-nourished.  HENT:  Head: Normocephalic.  Neck: Normal range of motion.  Respiratory: Effort normal.  Musculoskeletal: Normal range of motion.  Neurological: He is alert and oriented to person, place, and time.  Skin: Skin is warm and dry.  Psychiatric: He has a normal mood and affect. His speech is normal and behavior is normal. Judgment and thought content normal. Cognition and memory are normal.    Review of Systems  Constitutional: Negative.   HENT: Negative.   Eyes: Negative.   Respiratory: Negative.   Cardiovascular: Negative.   Gastrointestinal: Negative.   Genitourinary: Negative.   Musculoskeletal: Negative.   Skin: Negative.   Neurological: Negative.   Endo/Heme/Allergies: Negative.   Psychiatric/Behavioral: Positive for substance abuse.    Blood pressure 110/71, pulse 83, temperature 98.1 F (36.7 C), temperature source Oral, resp. rate  18, SpO2 100 %.There is no height or weight on file to calculate BMI.  General Appearance: Casual  Eye Contact:  Good  Speech:  Normal Rate  Volume:  Normal  Mood:  Euthymic  Affect:  Congruent  Thought Process:  Coherent and Descriptions of Associations: Intact  Orientation:  Full (Time, Place, and Person)  Thought Content:  WDL  Suicidal Thoughts:  No  Homicidal Thoughts:  No  Memory:  Immediate;   Good Recent;   Good Remote;   Good  Judgement:  Fair  Insight:  Fair  Psychomotor Activity:  Normal  Concentration:  Concentration: Good and Attention Span: Good  Recall:  Good  Fund of Knowledge:  Fair  Language:  Good  Akathisia:  No  Handed:  Right  AIMS (if indicated):     Assets:  Leisure Time Physical Health Resilience Social Support  ADL's:  Intact  Cognition:  WNL  Sleep:      Mental Status Per Nursing Assessment::   On Admission:   psychosis  Demographic Factors:  Male  Loss Factors: NA  Historical Factors: NA  Risk Reduction Factors:   Sense of responsibility to family, Living with another person, especially a relative and Positive social support  Continued Clinical Symptoms:  None  Cognitive Features That Contribute To Risk:  None    Suicide Risk:  Minimal: No identifiable suicidal ideation.  Patients presenting with no risk factors but with morbid ruminations; may be classified as minimal risk based on the severity of the depressive symptoms    Plan Of Care/Follow-up recommendations:  Activity:  as tolerated Diet:  heart healthy diet  Meshia Rau, NP 04/07/2016, 10:04 AM

## 2016-11-15 ENCOUNTER — Emergency Department (HOSPITAL_COMMUNITY)
Admission: EM | Admit: 2016-11-15 | Discharge: 2016-11-16 | Disposition: A | Discharge status: Home / Self Care | Payer: Self-pay | Attending: Emergency Medicine | Admitting: Emergency Medicine

## 2016-11-15 DIAGNOSIS — F1990 Other psychoactive substance use, unspecified, uncomplicated: Secondary | ICD-10-CM

## 2016-11-15 DIAGNOSIS — F17210 Nicotine dependence, cigarettes, uncomplicated: Secondary | ICD-10-CM | POA: Insufficient documentation

## 2016-11-15 DIAGNOSIS — F419 Anxiety disorder, unspecified: Secondary | ICD-10-CM

## 2016-11-15 DIAGNOSIS — J45909 Unspecified asthma, uncomplicated: Secondary | ICD-10-CM | POA: Insufficient documentation

## 2016-11-15 DIAGNOSIS — F29 Unspecified psychosis not due to a substance or known physiological condition: Secondary | ICD-10-CM

## 2016-11-15 DIAGNOSIS — F1590 Other stimulant use, unspecified, uncomplicated: Secondary | ICD-10-CM | POA: Insufficient documentation

## 2016-11-15 DIAGNOSIS — Z79899 Other long term (current) drug therapy: Secondary | ICD-10-CM | POA: Insufficient documentation

## 2016-11-15 MED ORDER — LORAZEPAM 2 MG/ML IJ SOLN
1.0000 mg | Freq: Once | INTRAMUSCULAR | Status: AC
  Administered 2016-11-15: 1 mg via INTRAVENOUS
  Filled 2016-11-15: qty 1

## 2016-11-15 NOTE — ED Notes (Signed)
Bed: WA17 Expected date:  Expected time:  Means of arrival:  Comments: Ems  
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
## 2016-11-15 NOTE — ED Notes (Signed)
Pt was given a sandwich
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
## 2016-11-15 NOTE — ED Notes (Addendum)
Admits to drinking 4-5 beers tonight, Pt states that he did use some molly. Pt is anxious and states that he feels like he has panic and anxiety issues and has been seeking help.

## 2016-11-16 NOTE — Discharge Instructions (Signed)
Please read and follow all provided instructions.  Your diagnoses today include:  1. Anxiety   2. Drug use   3. Psychosis, unspecified psychosis type    Tests performed today include:  Vital signs. See below for your results today.   Medications prescribed:   None  Take any prescribed medications only as directed.  Home care instructions:  Follow any educational materials contained in this packet.  Follow-up instructions: Please follow-up with your primary care provider in the next 3 days for further evaluation of your symptoms.   Return instructions:   Please return to the Emergency Department if you experience worsening symptoms.   Please return if you have any other emergent concerns.  Additional Information:  Your vital signs today were: BP 134/87    Pulse 81    Temp 98.4 F (36.9 C) (Oral)    Resp 16    SpO2 97%  If your blood pressure (BP) was elevated above 135/85 this visit, please have this repeated by your doctor within one month. --------------

## 2016-11-21 ENCOUNTER — Encounter (HOSPITAL_COMMUNITY): Payer: Self-pay | Admitting: Emergency Medicine

## 2016-11-21 ENCOUNTER — Emergency Department (HOSPITAL_COMMUNITY)
Admission: EM | Admit: 2016-11-21 | Discharge: 2016-11-22 | Disposition: A | Discharge status: Home / Self Care | Payer: PRIVATE HEALTH INSURANCE | Attending: Emergency Medicine | Admitting: Emergency Medicine

## 2016-11-21 DIAGNOSIS — F1910 Other psychoactive substance abuse, uncomplicated: Secondary | ICD-10-CM

## 2016-11-21 DIAGNOSIS — F10230 Alcohol dependence with withdrawal, uncomplicated: Secondary | ICD-10-CM | POA: Diagnosis present

## 2016-11-21 DIAGNOSIS — F321 Major depressive disorder, single episode, moderate: Secondary | ICD-10-CM | POA: Insufficient documentation

## 2016-11-21 DIAGNOSIS — F17210 Nicotine dependence, cigarettes, uncomplicated: Secondary | ICD-10-CM | POA: Insufficient documentation

## 2016-11-21 DIAGNOSIS — Z5181 Encounter for therapeutic drug level monitoring: Secondary | ICD-10-CM | POA: Insufficient documentation

## 2016-11-21 DIAGNOSIS — J45909 Unspecified asthma, uncomplicated: Secondary | ICD-10-CM | POA: Insufficient documentation

## 2016-11-21 DIAGNOSIS — F14151 Cocaine abuse with cocaine-induced psychotic disorder with hallucinations: Secondary | ICD-10-CM | POA: Diagnosis present

## 2016-11-21 DIAGNOSIS — F331 Major depressive disorder, recurrent, moderate: Secondary | ICD-10-CM

## 2016-11-21 LAB — COMPREHENSIVE METABOLIC PANEL
ALBUMIN: 3.9 g/dL (ref 3.5–5.0)
ALK PHOS: 68 U/L (ref 38–126)
ALT: 38 U/L (ref 17–63)
ANION GAP: 8 (ref 5–15)
AST: 50 U/L — ABNORMAL HIGH (ref 15–41)
BILIRUBIN TOTAL: 0.5 mg/dL (ref 0.3–1.2)
BUN: 8 mg/dL (ref 6–20)
CALCIUM: 9.4 mg/dL (ref 8.9–10.3)
CO2: 27 mmol/L (ref 22–32)
CREATININE: 0.7 mg/dL (ref 0.61–1.24)
Chloride: 99 mmol/L — ABNORMAL LOW (ref 101–111)
GFR calc Af Amer: >60 mL/min (ref >60)
GFR calc non Af Amer: >60 mL/min (ref >60)
GLUCOSE: 94 mg/dL (ref 65–99)
Potassium: 3.6 mmol/L (ref 3.5–5.1)
Sodium: 134 mmol/L — ABNORMAL LOW (ref 135–145)
TOTAL PROTEIN: 7.6 g/dL (ref 6.5–8.1)

## 2016-11-21 LAB — CBC WITH DIFFERENTIAL/PLATELET
Basophils Absolute: 0 10*3/uL (ref 0.0–0.1)
Basophils Relative: 0 %
EOS PCT: 2 %
Eosinophils Absolute: 0.1 10*3/uL (ref 0.0–0.7)
HCT: 41.1 % (ref 39.0–52.0)
HEMOGLOBIN: 14.3 g/dL (ref 13.0–17.0)
LYMPHS ABS: 1.7 10*3/uL (ref 0.7–4.0)
Lymphocytes Relative: 25 %
MCH: 33 pg (ref 26.0–34.0)
MCHC: 34.8 g/dL (ref 30.0–36.0)
MCV: 94.9 fL (ref 78.0–100.0)
MONO ABS: 0.7 10*3/uL (ref 0.1–1.0)
MONOS PCT: 10 %
NEUTROS PCT: 63 %
Neutro Abs: 4.4 10*3/uL (ref 1.7–7.7)
PLATELETS: 316 10*3/uL (ref 150–400)
RBC: 4.33 MIL/uL (ref 4.22–5.81)
RDW: 13.1 % (ref 11.5–15.5)
WBC: 6.9 10*3/uL (ref 4.0–10.5)

## 2016-11-21 LAB — RAPID URINE DRUG SCREEN, HOSP PERFORMED
AMPHETAMINES: NOT DETECTED
BARBITURATES: NOT DETECTED
Benzodiazepines: NOT DETECTED
Cocaine: NOT DETECTED
Opiates: NOT DETECTED
TETRAHYDROCANNABINOL: POSITIVE — AB

## 2016-11-21 LAB — ETHANOL: Alcohol, Ethyl (B): <5 mg/dL (ref <5)

## 2016-11-21 LAB — SALICYLATE LEVEL

## 2016-11-21 LAB — ACETAMINOPHEN LEVEL

## 2016-11-21 MED ORDER — HYDROXYZINE HCL 25 MG PO TABS
25.0000 mg | ORAL_TABLET | Freq: Three times a day (TID) | ORAL | Status: DC | PRN
  Administered 2016-11-21: 25 mg via ORAL
  Filled 2016-11-21: qty 1

## 2016-11-21 NOTE — ED Notes (Signed)
Hourly rounding reveals patient sleeping in room. No complaints, stable, in no acute distress. Q15 minute rounds and monitoring via Security Cameras to continue. 

## 2016-11-21 NOTE — ED Notes (Signed)
Report to include Situation, Background, Assessment, and Recommendations received from Dawnley RN. Patient alert and oriented, warm and dry, in no acute distress. Patient denies SI, HI, AVH and pain. Patient made aware of Q15 minute rounds and security cameras for their safety. Patient instructed to come to me with needs or concerns. 

## 2016-11-21 NOTE — BH Assessment (Addendum)
Assessment Note  Francisco Jacobs is an 36 y.o. male that presents this date with severe depression and substance induced psychosis. Patient speaks in a low/soft voice and is oriented to place only. Patient does deny S/I, H/I or AVH although it is unclear if patient understands content of this writer's questions. Patient appears to be impaired and keeps his mouth covered with a blanket and just nods "yes and no" during assessment. Parts of patient's history was UTA. Information for purposes of assessment was gathered from a previous admission in 2015 and notes. Per notes, "Patient presents with depression and a significant history of polysubstance abuse (ETOH, molly, cocaine). He states that he last used molly this morning. He states he wants help detoxing from drugs. He is homeless and stays at the [facility] and they are trying to help him get a job and go to school but he states it's difficult due to his drug use. He reports severe depression but no SI/HI. He repeatedly states "he's sorry" and is "embarrassed" about his drug use. He also reports AVH stating that he "sees spirits". He denies any previous treatment for substance abuse or depression". Case was staffed with Shaune Pollack DNP who recommended patient be re-evaluated in the a.m.    Diagnosis: MDD recurrent with psychotic features, severe Polysubstance abuse   Past Medical History:  Past Medical History:  Diagnosis Date  . Ankle fracture   . Asthma    as a child only  . ETOH abuse   . Migraine    "don't remember the last time I had one" (05/11/2014)    Past Surgical History:  Procedure Laterality Date  . NO PAST SURGERIES    . ORIF FIBULA FRACTURE Left 09/19/2014   Procedure: OPEN REDUCTION INTERNAL FIXATION (ORIF) FIBULA FRACTURE, Open Reduction Internal Fixation Syndesmosis;  Surgeon: Eldred Manges, MD;  Location: [facility] OR;  Service: Orthopedics;  Laterality: Left;    Family History: History reviewed. No pertinent family history.  Social History:   reports that he has been smoking Cigarettes.  He has a 10.00 pack-year smoking history. He has never used smokeless tobacco. He reports that he drinks about 63.0 oz of alcohol per week . He reports that he uses drugs, including Cocaine and Marijuana, about 7 times per week.  Additional Social History:  Alcohol / Drug Use Pain Medications: none reported Prescriptions: none reported Over the Counter: none reported History of alcohol / drug use?: Yes Longest period of sobriety (when/how long): Unknown Negative Consequences of Use: Financial, Personal relationships Withdrawal Symptoms: Agitation, Weakness Substance #1 Name of Substance 1: Ectasy 1 - Age of First Use: UTA 1 - Amount (size/oz): UTA 1 - Frequency: UTA 1 - Duration: UTA 1 - Last Use / Amount: UTA Substance #2 Name of Substance 2: Cocaine 2 - Age of First Use: 21 2 - Amount (size/oz): $50-100 worth 2 - Frequency: was using daily- not currently 2 - Duration: ongoing 2 - Last Use / Amount: Unknown  CIWA: CIWA-Ar BP: (!) 145/72 Pulse Rate: 75 COWS:    Allergies: No Known Allergies  Home Medications:  (Not in a hospital admission)  OB/GYN Status:  No LMP for male patient.  General Assessment Data Location of Assessment: [facility] TTS Assessment: In system Is this a Tele or Face-to-Face Assessment?: Face-to-Face Is this an Initial Assessment or a Re-assessment for this encounter?: Initial Assessment Marital status: Single Maiden name: NA Is patient pregnant?: No Pregnancy Status: No Living Arrangements: Alone (Homeless) Can pt return to current  living arrangement?: Yes Admission Status: Voluntary Is patient capable of signing voluntary admission?: Yes Referral Source: Self/Family/Friend Insurance type: Self Pay  Medical Screening Exam [facility] Walk-in ONLY) Medical Exam completed: Yes  Crisis Care Plan Living Arrangements: Alone (Homeless) Legal Guardian:  (na) Name of Psychiatrist: None Name of Therapist:  None  Education Status Is patient currently in school?: No Current Grade:  (UTA) Highest grade of school patient has completed:  (UTA) Name of school:  (UTA) Contact person:  (UTA)  Risk to self with the past 6 months Suicidal Ideation: No Has patient been a risk to self within the past 6 months prior to admission? : No Suicidal Intent: No Has patient had any suicidal intent within the past 6 months prior to admission? : No Is patient at risk for suicide?: No Suicidal Plan?: No Has patient had any suicidal plan within the past 6 months prior to admission? : No Access to Means: No What has been your use of drugs/alcohol within the last 12 months?: Current use Previous Attempts/Gestures: No How many times?: 0 Other Self Harm Risks:  (NA) Triggers for Past Attempts: Unknown Intentional Self Injurious Behavior: None Family Suicide History: No Recent stressful life event(s):  (UTA) Persecutory voices/beliefs?: No Depression: Yes Depression Symptoms: Guilt, Feeling worthless/self pity Substance abuse history and/or treatment for substance abuse?: Yes Suicide prevention information given to non-admitted patients: Not applicable  Risk to Others within the past 6 months Homicidal Ideation: No Does patient have any lifetime risk of violence toward others beyond the six months prior to admission? : No Thoughts of Harm to Others: No Current Homicidal Intent: No Current Homicidal Plan: No Access to Homicidal Means: No Identified Victim:  (na) History of harm to others?: No Assessment of Violence: None Noted Violent Behavior Description:  (na) Does patient have access to weapons?: No Criminal Charges Pending?: No Does patient have a court date: No Is patient on probation?: No  Psychosis Hallucinations: None noted Delusions: None noted  Mental Status Report Appearance/Hygiene: Unremarkable Eye Contact: Poor Motor Activity: Freedom of movement Speech: Soft, Slow Level of  Consciousness: Drowsy Mood: Depressed Affect: Depressed Anxiety Level: Minimal Thought Processes: Unable to Assess Judgement: Impaired Orientation: Place Obsessive Compulsive Thoughts/Behaviors: None  Cognitive Functioning Concentration: Decreased Memory: Unable to Assess IQ:  (UTA) Insight:  (UTA) Impulse Control:  (UTA) Appetite:  (UTA) Weight Loss:  (UTA) Weight Gain:  (UTA) Sleep:  (UTA) Total Hours of Sleep:  (UTA) Vegetative Symptoms: None  ADLScreening [facility] Assessment Services) Patient's cognitive ability adequate to safely complete daily activities?: Yes Patient able to express need for assistance with ADLs?: Yes Independently performs ADLs?: Yes (appropriate for developmental age)  Prior Inpatient Therapy Prior Inpatient Therapy: Yes (per record review) Prior Therapy Dates: 2015 Prior Therapy Facilty/Provider(s): [facility] Reason for Treatment: SA issues  Prior Outpatient Therapy Prior Outpatient Therapy:  (UTA) Prior Therapy Dates:  (UTA) Prior Therapy Facilty/Provider(s):  (UTA) Reason for Treatment:  (UTA) Does patient have an ACCT team?: Unknown Does patient have Intensive In-House Services?  : Unknown Does patient have [facility] services? : Unknown Does patient have P4CC services?: Unknown  ADL Screening (condition at time of admission) Patient's cognitive ability adequate to safely complete daily activities?: Yes Is the patient deaf or have difficulty hearing?: No Does the patient have difficulty seeing, even when wearing glasses/contacts?: No Does the patient have difficulty concentrating, remembering, or making decisions?: No Patient able to express need for assistance with ADLs?: Yes Does the patient have difficulty dressing or bathing?:  No Independently performs ADLs?: Yes (appropriate for developmental age) Does the patient have difficulty walking or climbing stairs?: No Weakness of Legs: None Weakness of Arms/Hands: None  Home Assistive  Devices/Equipment Home Assistive Devices/Equipment: None  Therapy Consults (therapy consults require a physician order) PT Evaluation Needed: No OT Evalulation Needed: No SLP Evaluation Needed: No Abuse/Neglect Assessment (Assessment to be complete while patient is alone) Physical Abuse: Denies Verbal Abuse: Denies Sexual Abuse: Denies Exploitation of patient/patient's resources: Denies Self-Neglect: Denies Values / Beliefs Cultural Requests During Hospitalization: None Spiritual Requests During Hospitalization: None Consults Spiritual Care Consult Needed: No Social Work Consult Needed: No Merchant navy officer (For Healthcare) Does Patient Have a Medical Advance Directive?: No Would patient like information on creating a medical advance directive?: No - Patient declined    Additional Information 1:1 In Past 12 Months?:  (UTA) CIRT Risk: No Elopement Risk: No Does patient have medical clearance?: Yes     Disposition: Case was staffed with Shaune Pollack DNP who recommended patient be re-evaluated in the a.m.   Disposition Initial Assessment Completed for this Encounter: Yes Disposition of Patient: Other dispositions Other disposition(s): Other (Comment) (Re-Evaluate in the a.m.)  On Site Evaluation by:   Reviewed with Physician:    Alfredia Ferguson 11/21/2016 6:32 PM

## 2016-11-21 NOTE — ED Triage Notes (Signed)
Pt reports he wants to be seen for depression. No SI/HI, hallucinations, or substance abuse.

## 2016-11-21 NOTE — ED Notes (Signed)
Bed: [facility] Expected date:  Expected time:  Means of arrival:  Comments: Francisco Jacobs

## 2016-11-21 NOTE — ED Provider Notes (Signed)
[facility]-EMERGENCY DEPT Provider Note   CSN: [id number] Arrival date & time: 11/21/16  1139     History   Chief Complaint Chief Complaint  Patient presents with  . Depression    HPI Francisco Jacobs is a 36 y.o. male who presents with depression. PMH significant for polysubstance abuse (ETOH, molly, cocaine). He states that he last used molly this morning. He states he wants help detoxing from drugs. He is homeless and stays at the [facility] and they are trying to help him get a job and go to school but he states it's difficult due to his drug use. He reports severe depression but no SI/HI. He repeatedly states "he's sorry" and is "embarrassed" about his drug use. He also reports AVH stating that he "sees spirits". He denies fever, chest pain, SOB, abdominal pain. He denies any previous treatment for substance abuse or depression.  HPI  Past Medical History:  Diagnosis Date  . Ankle fracture   . Asthma    as a child only  . ETOH abuse   . Migraine    "don't remember the last time I had one" (05/11/2014)    Patient Active Problem List   Diagnosis Date Noted  . Alcohol dependence with uncomplicated withdrawal (HCC) 04/07/2016  . 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 Bimalleolar fracture of left ankle 09/19/2014  . Polysubstance abuse 05/12/2014  . 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 Metabolic acidosis 05/11/2014  . Severe alcohol use disorder (HCC) 01/08/2014    Past Surgical History:  Procedure Laterality Date  . NO PAST SURGERIES    . ORIF FIBULA FRACTURE Left 09/19/2014   Procedure: OPEN REDUCTION INTERNAL FIXATION (ORIF) FIBULA FRACTURE, Open Reduction Internal Fixation Syndesmosis;  Surgeon: Eldred Manges, MD;  Location: [facility] OR;  Service: Orthopedics;  Laterality: Left;       Home Medications    Prior to Admission medications   Not on File    Family History History reviewed. No pertinent family  history.  Social History Social History  Substance Use Topics  . Smoking status: Current Some Day Smoker    Packs/day: 0.50    Years: 20.00    Types: Cigarettes  . Smokeless tobacco: Never Used  . Alcohol use 63.0 oz/week    105 Cans of beer per week     Comment: daily 6 beers     Allergies   Patient has no known allergies.   Review of Systems Review of Systems  Constitutional: Negative for fever.  Respiratory: Negative for shortness of breath.   Cardiovascular: Negative for chest pain.  Gastrointestinal: Negative for abdominal pain.  Neurological: Negative for headaches.  Psychiatric/Behavioral: Positive for dysphoric mood and hallucinations. Negative for self-injury and suicidal ideas. The patient is nervous/anxious.   All other systems reviewed and are negative.    Physical Exam Updated Vital Signs BP (!) 154/102 (BP Location: Left Arm)   Pulse 93   Temp 97.9 F (36.6 C) (Oral)   Resp 20   Ht  (1.803 m)   Wt 103 kg   SpO2 100%   BMI 31.68 kg/m   Physical Exam  Constitutional: He is oriented to person, place, and time. He appears well-developed and well-nourished. No distress.  HENT:  Head: Normocephalic and atraumatic.  Eyes: Conjunctivae are normal. Pupils are equal, round, and reactive to light. Right eye exhibits no discharge. Left eye exhibits no discharge. No scleral icterus.  Neck: Normal range of motion.  Cardiovascular: Normal rate.   Pulmonary/Chest: Effort normal. No respiratory distress.  Abdominal: He exhibits no distension.  Neurological: He is alert and oriented to person, place, and time.  Skin: Skin is warm and dry.  Psychiatric: His mood appears anxious. He is withdrawn. He exhibits a depressed mood. He expresses no homicidal and no suicidal ideation. He expresses no suicidal plans and no homicidal plans.  Nursing note and vitals reviewed.    ED Treatments / Results  Labs (all labs ordered are listed, but only abnormal results  are displayed) Labs Reviewed  COMPREHENSIVE METABOLIC PANEL - Abnormal; Notable for the following:       Result Value   Sodium 134 (*)    Chloride 99 (*)    AST 50 (*)    All other components within normal limits  RAPID URINE DRUG SCREEN, HOSP PERFORMED - Abnormal; Notable for the following:    Tetrahydrocannabinol POSITIVE (*)    All other components within normal limits  CBC WITH DIFFERENTIAL/PLATELET  ETHANOL  SALICYLATE LEVEL  ACETAMINOPHEN LEVEL    EKG  EKG Interpretation None       Radiology No results found.  Procedures Procedures (including critical care time)  Medications Ordered in ED Medications - No data to display   Initial Impression / Assessment and Plan / ED Course  I have reviewed the triage vital signs and the nursing notes.  Pertinent labs & imaging results that were available during my care of the patient were reviewed by me and considered in my medical decision making (see chart for details).  36 year old male presents with depressive symptoms due to polysubstance abuse. He is hypertensive. Otherwise vitals are normal. Labs unremarkable. UDS remarkable for THC. Pt medically cleared for TTS consult.   Final Clinical Impressions(s) / ED Diagnoses   Final diagnoses:  Substance abuse  Moderate episode of recurrent major depressive disorder [facility])    New Prescriptions New Prescriptions   No medications on file     Bethel Born, PA-C 11/21/16 1324    Lavera Guise, MD 11/22/16 [phone]

## 2016-11-21 NOTE — ED Notes (Signed)
Patient came back to the [facility] ambulatory.  He was oriented to the unit and to his surroundings.  He is pleasant and cooperative on first encounter.  States he has been very depressed, but does not feel suicidal.

## 2016-11-21 NOTE — ED Notes (Addendum)
Hourly rounding reveals patient in room. Pt. c/o some anxiety, stable, in no acute distress. Q15 minute rounds and monitoring via [employer] to continue.

## 2016-11-21 NOTE — ED Notes (Signed)
Hourly rounding reveals patient in room. No complaints, stable, in no acute distress. Q15 minute rounds and monitoring via Security Cameras to continue. 

## 2016-11-21 NOTE — ED Notes (Signed)
Bed: [facility] Expected date:  Expected time:  Means of arrival:  Comments: triage

## 2016-11-21 NOTE — BH Assessment (Signed)
[facility] Assessment Progress Note   Case was staffed with Lord DNP who recommended patient be re-evaluated in the a.m.    

## 2016-11-22 DIAGNOSIS — F1290 Cannabis use, unspecified, uncomplicated: Secondary | ICD-10-CM | POA: Diagnosis not present

## 2016-11-22 DIAGNOSIS — F14151 Cocaine abuse with cocaine-induced psychotic disorder with hallucinations: Secondary | ICD-10-CM | POA: Diagnosis not present

## 2016-11-22 DIAGNOSIS — F17210 Nicotine dependence, cigarettes, uncomplicated: Secondary | ICD-10-CM | POA: Diagnosis not present

## 2016-11-22 DIAGNOSIS — F10230 Alcohol dependence with withdrawal, uncomplicated: Secondary | ICD-10-CM | POA: Diagnosis not present

## 2016-11-22 NOTE — ED Notes (Signed)
Hourly rounding reveals patient sleeping in room. No complaints, stable, in no acute distress. Q15 minute rounds and monitoring via Security Cameras to continue. 

## 2016-11-22 NOTE — Consult Note (Signed)
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 Alcohol dependence with uncomplicated withdrawal ([place]) [F10.230] 04/07/2016    Priority: High  . 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 Bimalleolar fracture of left ankle [S82.842A] 09/19/2014  . Polysubstance abuse [F19.10] 05/12/2014  . 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 Metabolic acidosis [Z22.4] 05/11/2014  . Severe alcohol use disorder ([place]) [F10.20] 01/08/2014    Total Time spent with patient: 45 minutes  Subjective:   Francisco Jacobs is a 36 y.o. male patient is stable for discharge.  HPI:  36 yo male who came to the ED with cocaine abuse and hallucinations.  He was also doing molly and alcohol.  Today, he is clear with no hallucinations or suicidal/homicidal ideations or withdrawal symptoms.  Substance abuse resources provided with encouragement to follow-up.  Past Psychiatric History: substance abuse  Risk to Self: Suicidal Ideation: No Suicidal Intent: No Is patient at risk for suicide?: No Suicidal Plan?: No Access to Means: No What has been your use of drugs/alcohol within the last 12 months?: Current use How many times?: 0 Other Self Harm Risks:  (NA) Triggers for Past Attempts: Unknown Intentional Self Injurious Behavior: None Risk to Others: Homicidal Ideation: No Thoughts of Harm to Others: No Current Homicidal Intent: No Current Homicidal Plan: No Access to Homicidal Means: No Identified Victim:  (na) History of harm to others?: No Assessment of Violence: None Noted Violent Behavior Description:  (na) Does patient have  access to weapons?: No Criminal Charges Pending?: No Does patient have a court date: No Prior Inpatient Therapy: Prior Inpatient Therapy: Yes (per record review) Prior Therapy Dates: 2015 Prior Therapy Facilty/Provider(s): [facility] Reason for Treatment: SA issues Prior Outpatient Therapy: Prior Outpatient Therapy:  (UTA) Prior Therapy Dates:  (UTA) Prior Therapy Facilty/Provider(s):  (UTA) Reason for Treatment:  (UTA) Does patient have an ACCT team?: Unknown Does patient have Intensive In-House Services?  : Unknown Does patient have [facility] services? : Unknown Does patient have P4CC services?: Unknown  Past Medical History:  Past Medical History:  Diagnosis Date  . Ankle fracture   . Asthma    as a child only  . ETOH abuse   . Migraine    "don't remember the last time I had one" (05/11/2014)    Past Surgical History:  Procedure Laterality Date  . NO PAST SURGERIES    . ORIF FIBULA FRACTURE Left 09/19/2014   Procedure: OPEN REDUCTION INTERNAL FIXATION (ORIF) FIBULA FRACTURE, Open Reduction Internal Fixation Syndesmosis;  Surgeon: Marybelle Killings, MD;  Location: [place];  Service: Orthopedics;  Laterality: Left;   Family History: History reviewed. No pertinent family history. Family Psychiatric  History: none Social History:  History  Alcohol Use  . 63.0 oz/week  . 105 Cans of beer per week    Comment: daily 6 beers     History  Drug Use  . Frequency: 7.0 times per week  . Types: Cocaine, Marijuana    Comment: Thanksgiving 2015  last used  marijuan and cocaine    Social History   Social History  . Marital status: Single    Spouse name: N/A  . Number of children: N/A  . Years  of education: N/A   Social History Main Topics  . Smoking status: Current Some Day Smoker    Packs/day: 0.50    Years: 20.00    Types: Cigarettes  . Smokeless tobacco: Never Used  . Alcohol use 63.0 oz/week    105 Cans of beer per week     Comment: daily 6 beers  . Drug use: Yes    Frequency:  7.0 times per week    Types: Cocaine, Marijuana     Comment: Thanksgiving 2015  last used  marijuan and cocaine  . Sexual activity: Yes    Birth control/ protection: None   Other Topics Concern  . None   Social History Narrative  . None   Additional Social History:    Allergies:  No Known Allergies  Labs:  Results for orders placed or performed during the hospital encounter of 11/21/16 (from the past 48 hour(s))  Comprehensive metabolic panel     Status: Abnormal   Collection Time: 11/21/16 12:53 PM  Result Value Ref Range   Sodium 134 (L) 135 - 145 mmol/L   Potassium 3.6 3.5 - 5.1 mmol/L   Chloride 99 (L) 101 - 111 mmol/L   CO2 27 22 - 32 mmol/L   Glucose, Bld 94 65 - 99 mg/dL   BUN 8 6 - 20 mg/dL   Creatinine, Ser 0.70 0.61 - 1.24 mg/dL   Calcium 9.4 8.9 - 10.3 mg/dL   Total Protein 7.6 6.5 - 8.1 g/dL   Albumin 3.9 3.5 - 5.0 g/dL   AST 50 (H) 15 - 41 U/L   ALT 38 17 - 63 U/L   Alkaline Phosphatase 68 38 - 126 U/L   Total Bilirubin 0.5 0.3 - 1.2 mg/dL   GFR calc non Af Amer >60 >60 mL/min   GFR calc Af Amer >60 >60 mL/min    Comment: (NOTE) The eGFR has been calculated using the CKD EPI equation. This calculation has not been validated in all clinical situations. eGFR's persistently <60 mL/min signify possible Chronic Kidney Disease.    Anion gap 8 5 - 15  Ethanol     Status: None   Collection Time: 11/21/16 12:53 PM  Result Value Ref Range   Alcohol, Ethyl (B) <5 <5 mg/dL    Comment:        LOWEST DETECTABLE LIMIT FOR SERUM ALCOHOL IS 5 mg/dL FOR MEDICAL PURPOSES ONLY   CBC with Diff     Status: None   Collection Time: 11/21/16 12:53 PM  Result Value Ref Range   WBC 6.9 4.0 - 10.5 K/uL   RBC 4.33 4.22 - 5.81 MIL/uL   Hemoglobin 14.3 13.0 - 17.0 g/dL   HCT 41.1 39.0 - 52.0 %   MCV 94.9 78.0 - 100.0 fL   MCH 33.0 26.0 - 34.0 pg   MCHC 34.8 30.0 - 36.0 g/dL   RDW 13.1 11.5 - 15.5 %   Platelets 316 150 - 400 K/uL   Neutrophils Relative % 63 %   Neutro  Abs 4.4 1.7 - 7.7 K/uL   Lymphocytes Relative 25 %   Lymphs Abs 1.7 0.7 - 4.0 K/uL   Monocytes Relative 10 %   Monocytes Absolute 0.7 0.1 - 1.0 K/uL   Eosinophils Relative 2 %   Eosinophils Absolute 0.1 0.0 - 0.7 K/uL   Basophils Relative 0 %   Basophils Absolute 0.0 0.0 - 0.1 K/uL  Urine rapid drug screen (hosp performed)not at [facility]     Status: Abnormal  Collection Time: 11/21/16 12:53 PM  Result Value Ref Range   Opiates NONE DETECTED NONE DETECTED   Cocaine NONE DETECTED NONE DETECTED   Benzodiazepines NONE DETECTED NONE DETECTED   Amphetamines NONE DETECTED NONE DETECTED   Tetrahydrocannabinol POSITIVE (A) NONE DETECTED   Barbiturates NONE DETECTED NONE DETECTED    Comment:        DRUG SCREEN FOR MEDICAL PURPOSES ONLY.  IF CONFIRMATION IS NEEDED FOR ANY PURPOSE, NOTIFY LAB WITHIN 5 DAYS.        LOWEST DETECTABLE LIMITS FOR URINE DRUG SCREEN Drug Class       Cutoff (ng/mL) Amphetamine      1000 Barbiturate      200 Benzodiazepine   500 Tricyclics       938 Opiates          300 Cocaine          300 THC              50   Salicylate level     Status: None   Collection Time: 11/21/16 12:53 PM  Result Value Ref Range   Salicylate Lvl <1.8 2.8 - 30.0 mg/dL  Acetaminophen level     Status: Abnormal   Collection Time: 11/21/16 12:53 PM  Result Value Ref Range   Acetaminophen (Tylenol), Serum <10 (L) 10 - 30 ug/mL    Comment:        THERAPEUTIC CONCENTRATIONS VARY SIGNIFICANTLY. A RANGE OF 10-30 ug/mL MAY BE AN EFFECTIVE CONCENTRATION FOR MANY PATIENTS. HOWEVER, SOME ARE BEST TREATED AT CONCENTRATIONS OUTSIDE THIS RANGE. ACETAMINOPHEN CONCENTRATIONS >150 ug/mL AT 4 HOURS AFTER INGESTION AND >50 ug/mL AT 12 HOURS AFTER INGESTION ARE OFTEN ASSOCIATED WITH TOXIC REACTIONS.     Current Facility-Administered Medications  Medication Dose Route Frequency Provider Last Rate Last Dose  . hydrOXYzine (ATARAX/VISTARIL) tablet 25 mg  25 mg Oral TID PRN Rozetta Nunnery, NP    25 mg at 11/21/16 2111   No current outpatient prescriptions on file.    Musculoskeletal: Strength & Muscle Tone: within normal limits Gait & Station: normal Patient leans: N/A  Psychiatric Specialty Exam: Physical Exam  Constitutional: He is oriented to person, place, and time. He appears well-developed and well-nourished.  HENT:  Head: Normocephalic.  Neck: Normal range of motion.  Respiratory: Effort normal.  Musculoskeletal: Normal range of motion.  Neurological: He is alert and oriented to person, place, and time.  Psychiatric: He has a normal mood and affect. His speech is normal and behavior is normal. Judgment and thought content normal. Cognition and memory are normal.    Review of Systems  Psychiatric/Behavioral: Positive for substance abuse.  All other systems reviewed and are negative.   Blood pressure (!) 144/90, pulse 74, temperature 98 F (36.7 C), temperature source Oral, resp. rate 18, height 5' 11"  (1.803 m), weight 103 kg (227 lb 2 oz), SpO2 99 %.Body mass index is 31.68 kg/m.  General Appearance: Casual  Eye Contact:  Fair  Speech:  Normal Rate  Volume:  Normal  Mood:  Irritable, mild  Affect:  Congruent  Thought Process:  Coherent and Descriptions of Associations: Intact  Orientation:  Full (Time, Place, and Person)  Thought Content:  WDL and Logical  Suicidal Thoughts:  No  Homicidal Thoughts:  No  Memory:  Immediate;   Good Recent;   Good Remote;   Good  Judgement:  Fair  Insight:  Fair  Psychomotor Activity:  Normal  Concentration:  Concentration: Good and Attention Span: Good  Recall:  Good  Fund of Knowledge:  Fair  Language:  Good  Akathisia:  No  Handed:  Right  AIMS (if indicated):     Assets:  Leisure Time Physical Health Resilience  ADL's:  Intact  Cognition:  WNL  Sleep:        Treatment Plan Summary: Daily contact with patient to assess and evaluate symptoms and progress in treatment, Medication management and Plan cocaine  abuse with cocained induced psychosis with hallucinations:  -Crisis stabilization -Medication management:  None started as he needed to clear his medications -Individual and substance abuse counseling -Outpatient resources provided  Disposition: No evidence of imminent risk to self or others at present.    Waylan Boga, NP 11/22/2016 10:26 AM  Patient seen face-to-face for psychiatric evaluation, chart reviewed and case discussed with the physician extender and developed treatment plan. Reviewed the information documented and agree with the treatment plan. Corena Pilgrim, MD

## 2016-11-22 NOTE — Discharge Instructions (Signed)
For your ongoing mental health needs, you are advised to follow up with [facility].  New and returning patients are seen at their walk-in clinic.  Walk-in hours are Monday - Friday from 8:00 am - 3:00 pm.  Walk-in patients are seen on a first come, first served basis.  Try to arrive as early as possible for he best chance of being seen the same day: ° °     [facility] °     [address] °     [place], [place] [address] °     [phone] °

## 2016-11-22 NOTE — Progress Notes (Signed)
CSW spoke with patient at bedside regarding psychiatrist's recommendation for patient to follow up with [facility]. Patient lying in bed with eyes covered with towel. CSW inquired if patient foreseen any barriers that would prevent him from following up with [facility], patient reported none. CSW provided patient with information about [facility] and the services they provide and also local housing resources handout. CSW asked patient if he had any questions or concerns patient replied no.   Celso Sickle, LCSWA [facility]  Clinical Social Worker [phone]

## 2016-11-22 NOTE — ED Notes (Addendum)
Pt d/c home per MD order. Discharge summary reviewed with pt. Pt verbalizes understanding of discharge summary. Denies SI/HI/AVH. Pt signed for personal property and property returned. Pt signed e-signature. Ambulatory off unit with MHT.

## 2016-11-22 NOTE — B[facility] Suicide Risk Assessment (Signed)
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 Alcohol dependence with uncomplicated withdrawal ([facility]) [F10.230] 04/07/2016    Priority: High  . 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 Bimalleolar fracture of left ankle [S82.842A] 09/19/2014  . Polysubstance abuse [F19.10] 05/12/2014  . 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 Metabolic acidosis [E87.2] 05/11/2014  . Severe alcohol use disorder ([facility]) [F10.20] 01/08/2014    Total Time spent with patient: 45 minutes  Musculoskeletal: Strength & Muscle Tone: within normal limits Gait & Station: normal Patient leans: N/A  Psychiatric Specialty Exam: Physical Exam  Constitutional: He is oriented to person, place, and time. He appears well-developed and well-nourished.  HENT:  Head: Normocephalic.  Neck: Normal range of motion.  Respiratory: Effort normal.  Musculoskeletal: Normal range of motion.  Neurological: He is alert and oriented to person, place, and time.  Psychiatric: He has a normal mood and affect. His speech is normal and behavior is normal. Judgment and thought content normal. Cognition and memory are normal.    Review of Systems  Psychiatric/Behavioral: Positive for substance abuse.  All other systems reviewed and are negative.   Blood pressure (!) 144/90, pulse 74, temperature 98 F (36.7 C), temperature source Oral, resp. rate 18, height  (1.803 m), weight 103 kg (227 lb 2 oz), SpO2 99 %.Body mass index is 31.68 kg/m.  General Appearance: Casual  Eye Contact:  Fair  Speech:  Normal Rate  Volume:  Normal  Mood:  Irritable, mild  Affect:  Congruent  Thought Process:  Coherent and Descriptions of  Associations: Intact  Orientation:  Full (Time, Place, and Person)  Thought Content:  WDL and Logical  Suicidal Thoughts:  No  Homicidal Thoughts:  No  Memory:  Immediate;   Good Recent;   Good Remote;   Good  Judgement:  Fair  Insight:  Fair  Psychomotor Activity:  Normal  Concentration:  Concentration: Good and Attention Span: Good  Recall:  Good  Fund of Knowledge:  Fair  Language:  Good  Akathisia:  No  Handed:  Right  AIMS (if indicated):     Assets:  Leisure Time Physical Health Resilience  ADL's:  Intact  Cognition:  WNL  Sleep:      Mental Status Per Nursing Assessment::   On Admission:   cocaine abuse with hallucinations  Demographic Factors:  Male  Loss Factors: NA  Historical Factors: NA  Risk Reduction Factors:   Sense of responsibility to family  Continued Clinical Symptoms:  Irritable, mild  Cognitive Features That Contribute To Risk:  None    Suicide Risk:  Minimal: No identifiable suicidal ideation.  Patients presenting with no risk factors but with morbid ruminations; may be classified as minimal risk based on the severity of the depressive symptoms    Plan Of Care/Follow-up recommendations:  Activity:  as tolerated Diet:  heart healthy diet  LORD, JAMISON, NP 11/22/2016, 10:58 AM

## 2016-11-24 ENCOUNTER — Encounter (HOSPITAL_COMMUNITY): Payer: Self-pay | Admitting: Family Medicine

## 2016-11-24 DIAGNOSIS — Z5321 Procedure and treatment not carried out due to patient leaving prior to being seen by health care provider: Secondary | ICD-10-CM | POA: Insufficient documentation

## 2016-11-24 DIAGNOSIS — M7989 Other specified soft tissue disorders: Secondary | ICD-10-CM | POA: Insufficient documentation

## 2016-11-24 NOTE — ED Notes (Signed)
Attempted to call patient for assigned triage room with no answer.  

## 2016-11-24 NOTE — ED Triage Notes (Signed)
Patient is complaining of right knee pain and swelling that started about 2 days ago. Pt denies any injury and reports he has had to have fluid removed from the knee previously.

## 2016-11-25 ENCOUNTER — Emergency Department (HOSPITAL_COMMUNITY)
Admission: EM | Admit: 2016-11-25 | Discharge: 2016-11-25 | Disposition: A | Discharge status: Home / Self Care | Payer: Self-pay | Attending: Dermatology | Admitting: Dermatology

## 2016-11-25 NOTE — ED Notes (Signed)
Attempted to call patient for assigned room with no answer.  

## 2016-11-25 NOTE — ED Notes (Signed)
Unable to find pt in department at this time must assume pt has eloped.

## 2016-11-25 NOTE — ED Notes (Signed)
Pt not in room when assessment attempted

## 2016-11-25 NOTE — ED Notes (Signed)
Attempted to call report for assigned room but no answer.

## 2016-11-26 NOTE — Congregational Nurse Program (Signed)
Congregational Nurse Program Note  Date of Encounter: 11/09/2016  Past Medical History: Past Medical History:  Diagnosis Date  . Ankle fracture   . Asthma    as a child only  . ETOH abuse   . Migraine    "don't remember the last time I had one" (05/11/2014)    Encounter Details:     CNP Questionnaire - 11/09/16 1446      Patient Demographics   Is this a new or existing patient? New   Patient is considered a/an Not Applicable   Race African-American/Black     Patient Assistance   Location of Patient Assistance Not Applicable   Patient's financial/insurance status Low Income;Self-Pay (Uninsured)   Uninsured Patient ([employer]) Yes   Interventions Counseled to make appt. with provider;Assisted patient in making appt.   Patient referred to apply for the following financial assistance [employer] insecurities addressed Not Applicable   Transportation assistance No   Assistance securing medications No   Educational health offerings Behavioral health;Navigating the healthcare system     Encounter Details   Primary purpose of visit Acute Illness/Condition Visit;Safety;Navigating the Healthcare System   Was an Emergency Department visit averted? Yes   Does patient have a medical provider? No   Patient referred to Clinic;Area Agency   Was a mental health screening completed? (GAINS tool) No   Does patient have dental issues? No   Does patient have vision issues? No   Does your patient have an abnormal blood pressure today? No   Since previous encounter, have you referred patient for abnormal blood pressure that resulted in a new diagnosis or medication change? No   Does your patient have an abnormal blood glucose today? No   Since previous encounter, have you referred patient for abnormal blood glucose that resulted in a new diagnosis or medication change? No   Was there a life-saving intervention made? No     client is requesting  assistance with finding care for his anxiety and substance abuse.  States he is "fearful of loosing it", meaning he is not thinking clearly and that is getting worse.  Admits to substance abuse.  Denies SI or HI.  Assist client with arranging for an intake with [facility].  Client is to see them today.

## 2016-12-02 NOTE — Congregational Nurse Program (Signed)
Congregational Nurse Program Note  Date of Encounter: 11/30/2016  Past Medical History: Past Medical History:  Diagnosis Date  . Ankle fracture   . Asthma    as a child only  . ETOH abuse   . Migraine    "don't remember the last time I had one" (05/11/2014)    Encounter Details:     CNP Questionnaire - 11/30/16 0959      Patient Demographics   Is this a new or existing patient? Existing   Patient is considered a/an Not Applicable   Race African-American/Black     Patient Assistance   Location of Patient Assistance Not Applicable   Patient's financial/insurance status Low Income;Self-Pay (Uninsured)   Uninsured Patient ([employer], trade [employer]) Yes   Interventions Not Applicable   Patient referred to apply for the following financial assistance [facility] doctor insecurities addressed Not Technical brewer No   Assistance securing medications No   Educational health offerings Behavioral health;Navigating the healthcare system     Encounter Details   Primary purpose of visit Safety;Navigating the Healthcare System;Post ED/Hospitalization Visit   Was an Emergency Department visit averted? Not Applicable   Does patient have a medical provider? No   Patient referred to Clinic;Area Agency   Was a mental health screening completed? (GAINS tool) No   Does patient have dental issues? No   Does patient have vision issues? No   Does your patient have an abnormal blood pressure today? No   Since previous encounter, have you referred patient for abnormal blood pressure that resulted in a new diagnosis or medication change? No   Does your patient have an abnormal blood glucose today? No   Since previous encounter, have you referred patient for abnormal blood glucose that resulted in a new diagnosis or medication change? No   Was there a life-saving intervention made? No     Reports is doing "much better".  Affect bright with  appropriate energy.  Requested information about seeking counseling at [facility].  Provided requested information

## 2016-12-17 ENCOUNTER — Emergency Department (HOSPITAL_COMMUNITY)
Admission: EM | Admit: 2016-12-17 | Discharge: 2016-12-17 | Disposition: A | Discharge status: Home / Self Care | Payer: Self-pay | Attending: Emergency Medicine | Admitting: Emergency Medicine

## 2016-12-17 DIAGNOSIS — F17210 Nicotine dependence, cigarettes, uncomplicated: Secondary | ICD-10-CM | POA: Insufficient documentation

## 2016-12-17 DIAGNOSIS — F19921 Other psychoactive substance use, unspecified with intoxication with delirium: Secondary | ICD-10-CM | POA: Insufficient documentation

## 2016-12-17 DIAGNOSIS — J45909 Unspecified asthma, uncomplicated: Secondary | ICD-10-CM | POA: Insufficient documentation

## 2016-12-17 MED ORDER — HALOPERIDOL LACTATE 5 MG/ML IJ SOLN
5.0000 mg | Freq: Once | INTRAMUSCULAR | Status: AC
  Administered 2016-12-17: 5 mg via INTRAMUSCULAR
  Filled 2016-12-17: qty 1

## 2016-12-17 MED ORDER — LORAZEPAM 2 MG/ML IJ SOLN
INTRAMUSCULAR | Status: AC
  Filled 2016-12-17: qty 1

## 2016-12-17 MED ORDER — LORAZEPAM 2 MG/ML IJ SOLN
2.0000 mg | Freq: Once | INTRAMUSCULAR | Status: AC
  Administered 2016-12-17: 2 mg via INTRAMUSCULAR
  Filled 2016-12-17: qty 1

## 2016-12-17 MED ORDER — DIPHENHYDRAMINE HCL 50 MG/ML IJ SOLN
25.0000 mg | Freq: Once | INTRAMUSCULAR | Status: AC
  Administered 2016-12-17: 25 mg via INTRAMUSCULAR
  Filled 2016-12-17: qty 1

## 2016-12-17 NOTE — ED Notes (Signed)
Bed: [id number] Expected date:  Expected time:  Means of arrival:  Comments: EMS male found "delerious" on street-[facility]/ST

## 2016-12-17 NOTE — ED Provider Notes (Signed)
[facility]-EMERGENCY DEPT Provider Note   CSN: [id number] Arrival date & time: 12/17/16  0006  By signing my name below, I, Diona BrownerJennifer Gorman, attest that this documentation has been prepared under the direction and in the presence of TR[employer] AutomotiveKelly Adeliz Tonkinson, PA-C. Electronically Signed: Diona BrownerJennifer Gorman, ED Scribe. 12/17/16. 12:37 AM.  LEVEL V CAVEAT: HPI and ROS limited due to altered mental status.  History   Chief Complaint Chief Complaint  Patient presents with  . Drug Problem    Pt admits to taking Molly    HPI Francisco Jacobs is a 36 y.o. male with a PMHx of ETOH abuse and substance abuse who presents to the Emergency Department via EMS after he was found wandering the streets tonight, appearing delirious. Pt reports taking Molly earlier in the day on 12/16/16. Pt is very anxious and hallucinating. Appears paranoid.  The history is provided by the patient. The history is limited by the condition of the patient. No language interpreter was used.    Past Medical History:  Diagnosis Date  . Ankle fracture   . Asthma    as a child only  . ETOH abuse   . Migraine    "don't remember the last time I had one" (05/11/2014)    Patient Active Problem List   Diagnosis Date Noted  . Alcohol dependence with uncomplicated withdrawal (HCC) 04/07/2016  . 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 Bimalleolar fracture of left ankle 09/19/2014  . Polysubstance abuse 05/12/2014  . 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 Metabolic acidosis 05/11/2014  . Severe alcohol use disorder (HCC) 01/08/2014    Past Surgical History:  Procedure Laterality Date  . NO PAST SURGERIES    . ORIF FIBULA FRACTURE Left 09/19/2014   Procedure: OPEN REDUCTION INTERNAL FIXATION (ORIF) FIBULA FRACTURE, Open Reduction Internal Fixation Syndesmosis;  Surgeon: Eldred MangesMark C Yates, MD;  [facility];  Service: Orthopedics;  Laterality: Left;       Home Medications    Prior to  Admission medications   Not on File    Family History No family history on file.  Social History Social History  Substance Use Topics  . Smoking status: Current Every Day Smoker    Packs/day: 0.50    Years: 20.00    Types: Cigarettes  . Smokeless tobacco: Never Used  . Alcohol use 63.0 oz/week    105 Cans of beer per week     Comment: daily 6 beers     Allergies   Patient has no known allergies.   Review of Systems Review of Systems  Unable to perform ROS: Mental status change    Physical Exam Updated Vital Signs BP 129/81 (BP Location: Right Arm)   Pulse (!) 122   Resp 18   SpO2 96%   Physical Exam  Constitutional: He is oriented to person, place, and time. He appears well-developed and well-nourished. No distress.  HENT:  Head: Normocephalic and atraumatic.  Eyes: Conjunctivae and EOM are normal. No scleral icterus.  Dilated pupils, equal bilaterally  Neck: Normal range of motion.  Cardiovascular: Regular rhythm and intact distal pulses.   Tachycardic  Pulmonary/Chest: Effort normal. No respiratory distress. He has no wheezes. He has no rales.  Lungs CTAB  Musculoskeletal: Normal range of motion.  Neurological: He is alert and oriented to person, place, and time. He exhibits normal muscle tone. Coordination normal.  Skin: Skin is warm and dry. No rash noted. He is not diaphoretic. No erythema. No  pallor.  Psychiatric: His mood appears anxious. His speech is rapid and/or pressured. He is agitated and hyperactive. Thought content is paranoid.  Nursing note and vitals reviewed.    ED Treatments / Results  DIAGNOSTIC STUDIES: Oxygen Saturation is 96% on RA, adequate by my interpretation.   Labs (all labs ordered are listed, but only abnormal results are displayed) Labs Reviewed - No data to display  EKG  EKG Interpretation None       Radiology No results found.  Procedures Procedures (including critical care time)  Medications Ordered in  ED Medications  LORazepam (ATIVAN) injection 2 mg (not administered)  haloperidol lactate (HALDOL) injection 5 mg (not administered)  diphenhydrAMINE (BENADRYL) injection 25 mg (not administered)     Initial Impression / Assessment and Plan / ED Course  I have reviewed the triage vital signs and the nursing notes.  Pertinent labs & imaging results that were available during my care of the patient were reviewed by me and considered in my medical decision making (see chart for details).     12:30 AM Patient presents via EMS and [employer] after being found wandering the streets. He admits to using [facility] prior to arrival. Patient acutely agitated and anxious the emergency department. Patient tachycardic and paranoid. Difficult to examine and not cooperative despite Versed given by EMS prior to arrival. Will order Benadryl, Haldol, and Ativan.  4:57 AM Patient reassessed. Sleeping.  6:03 AM Patient reassessed. He is now awake and eating a sandwich. He is no longer tachycardic. He has ambulated to the bathroom unassisted. Plan for discharge. Resource guide provided.   Final Clinical Impressions(s) / ED Diagnoses   Final diagnoses:  Substance-induced delirium (HCC)    New Prescriptions New Prescriptions   No medications on file    I personally performed the services described in this documentation, which was scribed in my presence. The recorded information has been reviewed and is accurate.       Antony Madura, PA-C 12/17/16 1610    Nicanor Alcon, April, MD 12/17/16 [id number]

## 2016-12-17 NOTE — ED Triage Notes (Signed)
Pt found wondering in street with severe [facility]um [facility]. " I'm not going to lie to you I have did some Molly tonight" HR at the scene 150+ BPM  Pt very anxious and EMS unable to obtain full set of vitals , Versed 5 mg IM given per EMS

## 2017-01-05 ENCOUNTER — Encounter (HOSPITAL_COMMUNITY): Payer: Self-pay | Admitting: Emergency Medicine

## 2017-01-05 ENCOUNTER — Emergency Department (HOSPITAL_COMMUNITY)
Admission: EM | Admit: 2017-01-05 | Discharge: 2017-01-05 | Disposition: A | Discharge status: Home / Self Care | Payer: Self-pay | Attending: Emergency Medicine | Admitting: Emergency Medicine

## 2017-01-05 DIAGNOSIS — F1210 Cannabis abuse, uncomplicated: Secondary | ICD-10-CM | POA: Insufficient documentation

## 2017-01-05 DIAGNOSIS — F19951 Other psychoactive substance use, unspecified with psychoactive substance-induced psychotic disorder with hallucinations: Secondary | ICD-10-CM

## 2017-01-05 DIAGNOSIS — R442 Other hallucinations: Secondary | ICD-10-CM | POA: Insufficient documentation

## 2017-01-05 DIAGNOSIS — J45909 Unspecified asthma, uncomplicated: Secondary | ICD-10-CM | POA: Insufficient documentation

## 2017-01-05 DIAGNOSIS — F17210 Nicotine dependence, cigarettes, uncomplicated: Secondary | ICD-10-CM | POA: Insufficient documentation

## 2017-01-05 DIAGNOSIS — F1010 Alcohol abuse, uncomplicated: Secondary | ICD-10-CM | POA: Insufficient documentation

## 2017-01-05 DIAGNOSIS — F1910 Other psychoactive substance abuse, uncomplicated: Secondary | ICD-10-CM

## 2017-01-05 DIAGNOSIS — F1510 Other stimulant abuse, uncomplicated: Secondary | ICD-10-CM | POA: Insufficient documentation

## 2017-01-05 LAB — CBC WITH DIFFERENTIAL/PLATELET
Basophils Absolute: 0 10*3/uL (ref 0.0–0.1)
Basophils Relative: 1 %
Eosinophils Absolute: 0.1 10*3/uL (ref 0.0–0.7)
Eosinophils Relative: 1 %
HEMATOCRIT: 41.3 % (ref 39.0–52.0)
Hemoglobin: 14.4 g/dL (ref 13.0–17.0)
LYMPHS PCT: 21 %
Lymphs Abs: 1.5 10*3/uL (ref 0.7–4.0)
MCH: 32.3 pg (ref 26.0–34.0)
MCHC: 34.9 g/dL (ref 30.0–36.0)
MCV: 92.6 fL (ref 78.0–100.0)
MONO ABS: 0.8 10*3/uL (ref 0.1–1.0)
MONOS PCT: 12 %
NEUTROS ABS: 4.8 10*3/uL (ref 1.7–7.7)
Neutrophils Relative %: 65 %
Platelets: 302 10*3/uL (ref 150–400)
RBC: 4.46 MIL/uL (ref 4.22–5.81)
RDW: 12.6 % (ref 11.5–15.5)
WBC: 7.2 10*3/uL (ref 4.0–10.5)

## 2017-01-05 LAB — BASIC METABOLIC PANEL
Anion gap: 9 (ref 5–15)
BUN: 7 mg/dL (ref 6–20)
CALCIUM: 9.6 mg/dL (ref 8.9–10.3)
CHLORIDE: 102 mmol/L (ref 101–111)
CO2: 26 mmol/L (ref 22–32)
CREATININE: 0.88 mg/dL (ref 0.61–1.24)
GFR calc Af Amer: >60 mL/min (ref >60)
GFR calc non Af Amer: >60 mL/min (ref >60)
Glucose, Bld: 106 mg/dL — ABNORMAL HIGH (ref 65–99)
Potassium: 3.6 mmol/L (ref 3.5–5.1)
Sodium: 137 mmol/L (ref 135–145)

## 2017-01-05 LAB — ETHANOL: Alcohol, Ethyl (B): <5 mg/dL (ref <5)

## 2017-01-05 NOTE — ED Notes (Signed)
Pt refused vitals 

## 2017-01-05 NOTE — ED Notes (Signed)
Attempted to get vitals, but pt would not let me in his room.

## 2017-01-05 NOTE — ED Notes (Signed)
Bed: WA09 Expected date:  Expected time:  Means of arrival:  Comments: Paranoid, molly and meth use

## 2017-01-05 NOTE — Discharge Instructions (Signed)
You were seen today for drug-induced hallucinations. You need to abstain from using illegal drugs. You have previously be given outpatient resources..Marland Kitchen

## 2017-01-05 NOTE — ED Provider Notes (Signed)
[facility] Provider Note   CSN: [id number] Arrival date & time: 01/05/17  0001  By signing my name below, I, Ny'Kea Lewis, attest that this documentation has been prepared under the direction and in the presence of Sharnita Bogucki, Mayer Masker, MD. Electronically Signed: Karren Cobble, ED Scribe. 01/05/17. 12:59 AM.  History   Chief Complaint Chief Complaint  Patient presents with  . Hallucinations   The history is provided by the patient. No language interpreter was used.    HPI Comments: Francisco Jacobs is a 36 y.o. male with a PMHx of alcohol abuse, brought in by ambulance and accompanied by [employer], who presents to the Emergency Department complaining of sudden onset hallucination after drug usage. He reports using molly, meth, marijuana, and alcohol. [employer] note when they found pt he was having auditory/visual hallucination and he was sweating profusely. He also notes he has been on a four day binge. Pt states he wants help with drug addiction. Denies any physical complaints at this time. Patient well-known to our emergency department. History of the same. Denies suicidal or homicidal ideation  Past Medical History:  Diagnosis Date  . Ankle fracture   . Asthma    as a child only  . ETOH abuse   . Migraine    "don't remember the last time I had one" (05/11/2014)    Patient Active Problem List   Diagnosis Date Noted  . Alcohol dependence with uncomplicated withdrawal (HCC) 04/07/2016  . 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 Bimalleolar fracture of left ankle 09/19/2014  . Polysubstance abuse 05/12/2014  . 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 Metabolic acidosis 05/11/2014  . Severe alcohol use disorder (HCC) 01/08/2014    Past Surgical History:  Procedure Laterality Date  . NO PAST SURGERIES    . ORIF FIBULA FRACTURE Left 09/19/2014   Procedure: OPEN REDUCTION INTERNAL FIXATION (ORIF) FIBULA FRACTURE, Open Reduction  Internal Fixation Syndesmosis;  Surgeon: Eldred Manges, MD;  Location: [facility] OR;  Service: Orthopedics;  Laterality: Left;       Home Medications    Prior to Admission medications   Not on File    Family History No family history on file.  Social History Social History  Substance Use Topics  . Smoking status: Current Every Day Smoker    Packs/day: 0.50    Years: 20.00    Types: Cigarettes  . Smokeless tobacco: Never Used  . Alcohol use 63.0 oz/week    105 Cans of beer per week     Comment: daily 6 beers     Allergies   Patient has no known allergies.   Review of Systems Review of Systems  Constitutional: Positive for diaphoresis.  Respiratory: Negative for shortness of breath.   Cardiovascular: Negative for chest pain.  Gastrointestinal: Negative for abdominal pain.  Psychiatric/Behavioral: Positive for hallucinations. Negative for self-injury and suicidal ideas.  All other systems reviewed and are negative.  Physical Exam Updated Vital Signs BP 133/76   Pulse (!) 105   Temp 98 F (36.7 C) (Oral)   Resp 18   SpO2 99%   Physical Exam  Constitutional: He is oriented to person, place, and time.  Hyperstimulated at times but no acute distress  HENT:  Head: Normocephalic and atraumatic.  Cardiovascular: Regular rhythm and normal heart sounds.   No murmur heard. Tachycardia  Pulmonary/Chest: Effort normal and breath sounds normal. No respiratory distress. He has no wheezes.  Abdominal: Soft. Bowel sounds are normal.  There is no tenderness. There is no rebound.  Neurological: He is alert and oriented to person, place, and time.  Skin: He is diaphoretic.  Diaphoretic  Psychiatric:  At times agitated but redirectable  Nursing note and vitals reviewed.   ED Treatments / Results  DIAGNOSTIC STUDIES: Oxygen Saturation is 99% on RA, normal by my interpretation.   COORDINATION OF CARE: 12:30 AM-Discussed next steps with pt. Pt verbalized understanding and is  agreeable with the plan.   Labs (all labs ordered are listed, but only abnormal results are displayed) Labs Reviewed  BASIC METABOLIC PANEL - Abnormal; Notable for the following:       Result Value   Glucose, Bld 106 (*)    All other components within normal limits  ETHANOL  CBC WITH DIFFERENTIAL/PLATELET  RAPID URINE DRUG SCREEN, HOSP PERFORMED    EKG  EKG Interpretation None       Radiology No results found.  Procedures Procedures (including critical care time)  Medications Ordered in ED Medications - No data to display   Initial Impression / Assessment and Plan / ED Course  I have reviewed the triage vital signs and the nursing notes.  Pertinent labs & imaging results that were available during my care of the patient were reviewed by me and considered in my medical decision making (see chart for details).     Patient presents agitated and diaphoretic with hallucinations following polysubstance use and abuse. History of the same. He is agitated but cooperative. Vital signs are notable for mild tachycardia. Lab work is largely reassuring. He is not homicidal or suicidal. Will allow him to metabolize and reassess.  5:23 AM Patient requesting discharge. He is requesting smoke break. He remains cooperative and not homicidal or suicidal. Feel he is safe for discharge home.  After history, exam, and medical workup I feel the patient has been appropriately medically screened and is safe for discharge home. Pertinent diagnoses were discussed with the patient. Patient was given return precautions.   Final Clinical Impressions(s) / ED Diagnoses   Final diagnoses:  Polysubstance abuse  Drug induced hallucinations (HCC)    New Prescriptions New Prescriptions   No medications on file   I personally performed the services described in this documentation, which was scribed in my presence. The recorded information has been reviewed and is accurate.     Shon BatonHorton, Lola Czerwonka  F, MD 01/05/17 [phone]

## 2017-01-05 NOTE — ED Notes (Signed)
Pt is too high to answer psych assessment/general assessment questions at this time.  Unable to assess pt's orientation fully and accurately.  Will continue to monitor.  Pt in room at this time.  Cooperative w/some mild agitation.

## 2017-01-05 NOTE — ED Notes (Signed)
Patient is alert and oriented x3.  He was given DC instructions and follow up visit instructions.  Patient gave verbal understanding.  He was DC ambulatory under his own power to home.  V/S stable.  He was not showing any signs of distress on DC 

## 2017-01-05 NOTE — ED Triage Notes (Signed)
[facility] from [facility] [address] via EMS.  [employer] called d/t pt having panic attack.  Pt endorses molly and meth use.  Pt experiencing visual hallucinations, paranoia, and sweating profusely.  Refused all vital signs.  Oriented to self.

## 2017-01-07 NOTE — Congregational Nurse Program (Signed)
Congregational Nurse Program Note  Date of Encounter: 12/21/2016  Past Medical History: Past Medical History:  Diagnosis Date  . Ankle fracture   . Asthma    as a child only  . ETOH abuse   . Migraine    "don't remember the last time I had one" (05/11/2014)    Encounter Details:     CNP Questionnaire - 12/21/16 1606      Patient Demographics   Is this a new or existing patient? Existing   Patient is considered a/an Not Applicable   Race African-American/Black     Patient Assistance   Location of Patient Assistance Not Applicable   Patient's financial/insurance status Low Income;Self-Pay (Uninsured)   Uninsured Patient ([employer], trade unionCard/Care Connects) Yes   Interventions Not Applicable   Patient referred to apply for the following financial assistance [facility] doctor[facility]/Care Connects Renewal   Food insecurities addressed Not Technical brewerApplicable   Transportation assistance No   Assistance securing medications No   Educational health offerings Behavioral health;Navigating the healthcare system     Encounter Details   Primary purpose of visit Safety;Navigating the Healthcare System;Post ED/Hospitalization Visit   Was an Emergency Department visit averted? Not Applicable   Does patient have a medical provider? No   Patient referred to Not Applicable   Was a mental health screening completed? (GAINS tool) No   Does patient have dental issues? No   Does patient have vision issues? No   Does your patient have an abnormal blood pressure today? No   Since previous encounter, have you referred patient for abnormal blood pressure that resulted in a new diagnosis or medication change? No   Does your patient have an abnormal blood glucose today? No   Since previous encounter, have you referred patient for abnormal blood glucose that resulted in a new diagnosis or medication change? No   Was there a life-saving intervention made? No     Client checking in.  States he is "doing well".  [facility].

## 2017-01-16 ENCOUNTER — Emergency Department (HOSPITAL_COMMUNITY): Payer: Self-pay

## 2017-01-16 ENCOUNTER — Inpatient Hospital Stay (HOSPITAL_COMMUNITY)
Admission: EM | Admit: 2017-01-16 | Discharge: 2017-01-20 | DRG: 896 | Disposition: A | Discharge status: Home / Self Care | Payer: Self-pay | Attending: Internal Medicine | Admitting: Internal Medicine

## 2017-01-16 ENCOUNTER — Inpatient Hospital Stay (HOSPITAL_COMMUNITY): Payer: Self-pay

## 2017-01-16 DIAGNOSIS — E876 Hypokalemia: Secondary | ICD-10-CM | POA: Diagnosis present

## 2017-01-16 DIAGNOSIS — F1410 Cocaine abuse, uncomplicated: Secondary | ICD-10-CM | POA: Diagnosis present

## 2017-01-16 DIAGNOSIS — R4587 Impulsiveness: Secondary | ICD-10-CM | POA: Diagnosis present

## 2017-01-16 DIAGNOSIS — G9341 Metabolic encephalopathy: Secondary | ICD-10-CM | POA: Diagnosis present

## 2017-01-16 DIAGNOSIS — I10 Essential (primary) hypertension: Secondary | ICD-10-CM | POA: Diagnosis present

## 2017-01-16 DIAGNOSIS — R443 Hallucinations, unspecified: Secondary | ICD-10-CM | POA: Diagnosis not present

## 2017-01-16 DIAGNOSIS — D649 Anemia, unspecified: Secondary | ICD-10-CM | POA: Diagnosis present

## 2017-01-16 DIAGNOSIS — M79602 Pain in left arm: Secondary | ICD-10-CM | POA: Diagnosis present

## 2017-01-16 DIAGNOSIS — J189 Pneumonia, unspecified organism: Secondary | ICD-10-CM

## 2017-01-16 DIAGNOSIS — R52 Pain, unspecified: Secondary | ICD-10-CM

## 2017-01-16 DIAGNOSIS — F1210 Cannabis abuse, uncomplicated: Secondary | ICD-10-CM | POA: Diagnosis present

## 2017-01-16 DIAGNOSIS — E860 Dehydration: Secondary | ICD-10-CM

## 2017-01-16 DIAGNOSIS — Z590 Homelessness: Secondary | ICD-10-CM

## 2017-01-16 DIAGNOSIS — F1910 Other psychoactive substance abuse, uncomplicated: Secondary | ICD-10-CM

## 2017-01-16 DIAGNOSIS — F19921 Other psychoactive substance use, unspecified with intoxication with delirium: Secondary | ICD-10-CM

## 2017-01-16 DIAGNOSIS — F10231 Alcohol dependence with withdrawal delirium: Principal | ICD-10-CM | POA: Diagnosis present

## 2017-01-16 DIAGNOSIS — N179 Acute kidney failure, unspecified: Secondary | ICD-10-CM | POA: Diagnosis present

## 2017-01-16 DIAGNOSIS — F17210 Nicotine dependence, cigarettes, uncomplicated: Secondary | ICD-10-CM | POA: Diagnosis present

## 2017-01-16 DIAGNOSIS — R509 Fever, unspecified: Secondary | ICD-10-CM

## 2017-01-16 DIAGNOSIS — R740 Nonspecific elevation of levels of transaminase and lactic acid dehydrogenase [LDH]: Secondary | ICD-10-CM | POA: Diagnosis present

## 2017-01-16 DIAGNOSIS — R001 Bradycardia, unspecified: Secondary | ICD-10-CM | POA: Diagnosis present

## 2017-01-16 DIAGNOSIS — A419 Sepsis, unspecified organism: Secondary | ICD-10-CM | POA: Diagnosis present

## 2017-01-16 DIAGNOSIS — J690 Pneumonitis due to inhalation of food and vomit: Secondary | ICD-10-CM | POA: Diagnosis present

## 2017-01-16 DIAGNOSIS — R6510 Systemic inflammatory response syndrome (SIRS) of non-infectious origin without acute organ dysfunction: Secondary | ICD-10-CM | POA: Diagnosis present

## 2017-01-16 DIAGNOSIS — J181 Lobar pneumonia, unspecified organism: Secondary | ICD-10-CM

## 2017-01-16 LAB — URINALYSIS, ROUTINE W REFLEX MICROSCOPIC
BILIRUBIN URINE: NEGATIVE
GLUCOSE, UA: NEGATIVE mg/dL
Ketones, ur: NEGATIVE mg/dL
Nitrite: NEGATIVE
Protein, ur: 30 mg/dL — AB
SPECIFIC GRAVITY, URINE: 1.025 (ref 1.005–1.030)
pH: 6.5 (ref 5.0–8.0)

## 2017-01-16 LAB — I-STAT CHEM 8, ED
BUN: 7 mg/dL (ref 6–20)
Calcium, Ion: 1.18 mmol/L (ref 1.15–1.40)
Chloride: 106 mmol/L (ref 101–111)
Creatinine, Ser: 1.2 mg/dL (ref 0.61–1.24)
Glucose, Bld: 169 mg/dL — ABNORMAL HIGH (ref 65–99)
HCT: 48 % (ref 39.0–52.0)
Hemoglobin: 16.3 g/dL (ref 13.0–17.0)
Potassium: 3.8 mmol/L (ref 3.5–5.1)
Sodium: 143 mmol/L (ref 135–145)
TCO2: 10 mmol/L (ref 0–100)

## 2017-01-16 LAB — RAPID URINE DRUG SCREEN, HOSP PERFORMED
AMPHETAMINES: NOT DETECTED
BARBITURATES: NOT DETECTED
BENZODIAZEPINES: NOT DETECTED
COCAINE: POSITIVE — AB
Opiates: NOT DETECTED
Tetrahydrocannabinol: POSITIVE — AB

## 2017-01-16 LAB — CBC WITH DIFFERENTIAL/PLATELET
Basophils Absolute: 0 10*3/uL (ref 0.0–0.1)
Basophils Relative: 1 %
EOS ABS: 0.1 10*3/uL (ref 0.0–0.7)
EOS PCT: 1 %
HCT: 45.3 % (ref 39.0–52.0)
Hemoglobin: 14.8 g/dL (ref 13.0–17.0)
LYMPHS ABS: 2.7 10*3/uL (ref 0.7–4.0)
Lymphocytes Relative: 41 %
MCH: 32.2 pg (ref 26.0–34.0)
MCHC: 32.7 g/dL (ref 30.0–36.0)
MCV: 98.5 fL (ref 78.0–100.0)
MONO ABS: 0.2 10*3/uL (ref 0.1–1.0)
MONOS PCT: 3 %
Neutro Abs: 3.6 10*3/uL (ref 1.7–7.7)
Neutrophils Relative %: 54 %
PLATELETS: 278 10*3/uL (ref 150–400)
RBC: 4.6 MIL/uL (ref 4.22–5.81)
RDW: 12.5 % (ref 11.5–15.5)
WBC: 6.6 10*3/uL (ref 4.0–10.5)

## 2017-01-16 LAB — COMPREHENSIVE METABOLIC PANEL
ALT: 21 U/L (ref 17–63)
AST: 45 U/L — ABNORMAL HIGH (ref 15–41)
Albumin: 4.5 g/dL (ref 3.5–5.0)
Alkaline Phosphatase: 69 U/L (ref 38–126)
BUN: 8 mg/dL (ref 6–20)
CO2: 9 mmol/L — AB (ref 22–32)
Calcium: 10.1 mg/dL (ref 8.9–10.3)
Chloride: 103 mmol/L (ref 101–111)
Creatinine, Ser: 1.46 mg/dL — ABNORMAL HIGH (ref 0.61–1.24)
GFR, EST NON AFRICAN AMERICAN: 59 mL/min — AB (ref >60)
GLUCOSE: 176 mg/dL — AB (ref 65–99)
POTASSIUM: 3.8 mmol/L (ref 3.5–5.1)
SODIUM: 143 mmol/L (ref 135–145)
TOTAL PROTEIN: 8.8 g/dL — AB (ref 6.5–8.1)
Total Bilirubin: 0.2 mg/dL — ABNORMAL LOW (ref 0.3–1.2)

## 2017-01-16 LAB — I-STAT TROPONIN, ED: Troponin i, poc: 0.01 ng/mL (ref 0.00–0.08)

## 2017-01-16 LAB — LACTIC ACID, PLASMA: LACTIC ACID, VENOUS: 2.3 mmol/L — AB (ref 0.5–1.9)

## 2017-01-16 LAB — I-STAT CG4 LACTIC ACID, ED
LACTIC ACID, VENOUS: 1.26 mmol/L (ref 0.5–1.9)
Lactic Acid, Venous: >17 mmol/L (ref 0.5–1.9)

## 2017-01-16 LAB — PROCALCITONIN: Procalcitonin: 1.27 ng/mL

## 2017-01-16 LAB — APTT: aPTT: 26 seconds (ref 24–36)

## 2017-01-16 LAB — PROTIME-INR
INR: 1.26
PROTHROMBIN TIME: 15.9 s — AB (ref 11.4–15.2)

## 2017-01-16 LAB — URINALYSIS, MICROSCOPIC (REFLEX)
Bacteria, UA: NONE SEEN
SQUAMOUS EPITHELIAL / LPF: NONE SEEN

## 2017-01-16 MED ORDER — FOLIC ACID 1 MG PO TABS
1.0000 mg | ORAL_TABLET | Freq: Every day | ORAL | Status: DC
  Administered 2017-01-17 – 2017-01-20 (×4): 1 mg via ORAL
  Filled 2017-01-16 (×4): qty 1

## 2017-01-16 MED ORDER — VITAMIN B-1 100 MG PO TABS
100.0000 mg | ORAL_TABLET | Freq: Every day | ORAL | Status: DC
  Administered 2017-01-17 – 2017-01-20 (×4): 100 mg via ORAL
  Filled 2017-01-16 (×4): qty 1

## 2017-01-16 MED ORDER — ACETAMINOPHEN 650 MG RE SUPP
650.0000 mg | Freq: Once | RECTAL | Status: AC
  Administered 2017-01-16: 650 mg via RECTAL
  Filled 2017-01-16: qty 1

## 2017-01-16 MED ORDER — LORAZEPAM 2 MG/ML IJ SOLN
1.0000 mg | Freq: Once | INTRAMUSCULAR | Status: AC
  Administered 2017-01-16: 1 mg via INTRAVENOUS
  Filled 2017-01-16: qty 1

## 2017-01-16 MED ORDER — LORAZEPAM 2 MG/ML IJ SOLN
1.0000 mg | Freq: Once | INTRAMUSCULAR | Status: AC
  Administered 2017-01-16: 1 mg via INTRAVENOUS

## 2017-01-16 MED ORDER — SODIUM CHLORIDE 0.9 % IV BOLUS (SEPSIS)
1000.0000 mL | Freq: Once | INTRAVENOUS | Status: AC
  Administered 2017-01-16: 1000 mL via INTRAVENOUS

## 2017-01-16 MED ORDER — ACETAMINOPHEN 325 MG PO TABS: 650.0000 mg | ORAL_TABLET | Freq: Four times a day (QID) | ORAL | Status: DC | PRN

## 2017-01-16 MED ORDER — ENOXAPARIN SODIUM 40 MG/0.4ML ~~LOC~~ SOLN
40.0000 mg | SUBCUTANEOUS | Status: DC
  Administered 2017-01-16: 40 mg via SUBCUTANEOUS
  Filled 2017-01-16: qty 0.4

## 2017-01-16 MED ORDER — AMOXICILLIN-POT CLAVULANATE 875-125 MG PO TABS
1.0000 | ORAL_TABLET | Freq: Two times a day (BID) | ORAL | Status: DC
  Administered 2017-01-16 – 2017-01-17 (×2): 1 via ORAL
  Filled 2017-01-16 (×2): qty 1

## 2017-01-16 MED ORDER — ADULT MULTIVITAMIN W/MINERALS CH
1.0000 | ORAL_TABLET | Freq: Every day | ORAL | Status: DC
Start: 2017-01-17 — End: 2017-01-20
  Administered 2017-01-17 – 2017-01-20 (×4): 1 via ORAL
  Filled 2017-01-16 (×4): qty 1

## 2017-01-16 MED ORDER — DEXTROSE 5 % IV SOLN
500.0000 mg | Freq: Once | INTRAVENOUS | Status: AC
  Administered 2017-01-16: 500 mg via INTRAVENOUS
  Filled 2017-01-16: qty 500

## 2017-01-16 MED ORDER — SODIUM CHLORIDE 0.9 % IV SOLN
INTRAVENOUS | Status: DC
  Administered 2017-01-17 (×2): via INTRAVENOUS

## 2017-01-16 MED ORDER — THIAMINE HCL 100 MG/ML IJ SOLN
Freq: Once | INTRAVENOUS | Status: AC
  Administered 2017-01-16: 22:00:00 via INTRAVENOUS
  Filled 2017-01-16: qty 1000

## 2017-01-16 MED ORDER — ONDANSETRON HCL 4 MG/2ML IJ SOLN: 4.0000 mg | Freq: Four times a day (QID) | INTRAMUSCULAR | Status: DC | PRN

## 2017-01-16 MED ORDER — DEXTROSE 5 % IV SOLN
1.0000 g | Freq: Once | INTRAVENOUS | Status: AC
  Administered 2017-01-16: 1 g via INTRAVENOUS
  Filled 2017-01-16: qty 10

## 2017-01-16 MED ORDER — ONDANSETRON HCL 4 MG PO TABS: 4.0000 mg | ORAL_TABLET | Freq: Four times a day (QID) | ORAL | Status: DC | PRN

## 2017-01-16 MED ORDER — SODIUM CHLORIDE 0.9 % IV SOLN
INTRAVENOUS | Status: DC
  Administered 2017-01-16: 15:00:00 via INTRAVENOUS

## 2017-01-16 MED ORDER — LORAZEPAM 2 MG/ML IJ SOLN
2.0000 mg | INTRAMUSCULAR | Status: DC | PRN
  Administered 2017-01-16 – 2017-01-17 (×4): 2 mg via INTRAVENOUS
  Administered 2017-01-17: 3 mg via INTRAVENOUS
  Administered 2017-01-17: 2 mg via INTRAVENOUS
  Filled 2017-01-16 (×4): qty 1
  Filled 2017-01-16: qty 2
  Filled 2017-01-16: qty 1

## 2017-01-16 MED ORDER — ACETAMINOPHEN 650 MG RE SUPP: 650.0000 mg | Freq: Four times a day (QID) | RECTAL | Status: DC | PRN

## 2017-01-16 NOTE — ED Notes (Signed)
Called floor to see if could bring patient up. Was told that we can start the 20 minute timer at this time.

## 2017-01-16 NOTE — ED Triage Notes (Signed)
Pt brought in handcuffs by [employer], per [employer] pt was combative, in back police vehicle pt broke car seat, broke windows, bent frame of car from within back seat. Pt arousable to verbal stumuli, oriented x 4. Reports using methamphetamine. HR 176. Diaphoretic.

## 2017-01-16 NOTE — ED Notes (Signed)
Bed: RESA Expected date:  Expected time:  Means of arrival:  Comments: 

## 2017-01-16 NOTE — H&P (Signed)
[facility] History and Physical  Calogero Geisen ZOX:[id number] DOB: [birth year] DOA: 01/16/2017  PCP: Lavinia Sharps, NP  Patient coming from: [place] (by Police)  Chief Complaint: Altered mental status  HPI: Francisco Jacobs is a 36 y.o. male with a medical history of polysubstance abuse, alcohol abuse, who presented to the emergency department with altered mental status. Patient was found by police wandering downtown earlier today with confusion. He was brought in and found to be very dehydrated. Upon my examination, patient was alert and oriented, no longer confused. Patient states he has been doing meth, Molly, cocaine, marijuana, and drinking. Patient admits to drinking 212 packs of beer per day. Currently denies any chest pain, shortness of breath, abdominal pain, nausea or vomiting, diarrhea or constipation, cough, fever, recent illness or sick contacts, recent travel.  ED Course: Found to have an elevated lactic acid on admission, patient was very disoriented and confused. Given IV fluids and resuscitated. Patient with fever and tachycardia, with possible pneumonia on x-ray, given azithromycin and ceftriaxone. [facility] called for admission.  Review of Systems:  All other systems reviewed and are negative.   Past Medical History:  Diagnosis Date  . Ankle fracture   . Asthma    as a child only  . ETOH abuse   . Migraine    "don't remember the last time I had one" (05/11/2014)    Past Surgical History:  Procedure Laterality Date  . NO PAST SURGERIES    . ORIF FIBULA FRACTURE Left 09/19/2014   Procedure: OPEN REDUCTION INTERNAL FIXATION (ORIF) FIBULA FRACTURE, Open Reduction Internal Fixation Syndesmosis;  Surgeon: Eldred Manges, MD;  Location: [facility] OR;  Service: Orthopedics;  Laterality: Left;    Social History:  reports that he has been smoking Cigarettes.  He has a 10.00 pack-year smoking history. He has never used smokeless tobacco. He reports that he drinks about  63.0 oz of alcohol per week . He reports that he uses drugs, including Cocaine and Marijuana, about 7 times per week.  No Known Allergies  Family history Patient denies history of diabetes, hypertension, coronary artery disease in his family.  Prior to Admission medications   Not on File    Physical Exam: Vitals:   01/16/17 1600 01/16/17 1615  BP: (!) 142/85 127/70  Pulse: (!) 127 (!) 128  Resp: (!) 31 (!) 29  Temp:       General: Well developed, well nourished, NAD, appears stated age  HEENT: NCAT, PERRLA, EOMI, Anicteic Sclera, mucous membranes moist.   Neck: Supple, no JVD, no masses  Cardiovascular: S1 S2 auscultated, Tachycardic, no murmur appreciated  Respiratory: Clear to auscultation bilaterally with equal chest rise  Abdomen: Soft, nontender, nondistended, + bowel sounds  Extremities: warm dry without cyanosis clubbing or edema  Neuro: AAOx3, cranial nerves intact tested, pain with movement of left upper extremity otherwise nonfocal  Skin: Without rashes exudates or nodules, multiple tattoos  Psych: Normal affect and demeanor with intact judgement and insight  Labs on Admission: I have personally reviewed following labs and imaging studies CBC:  Recent Labs Lab 01/16/17 1409 01/16/17 1412  WBC 6.6  --   NEUTROABS 3.6  --   HGB 14.8 16.3  HCT 45.3 48.0  MCV 98.5  --   PLT 278  --    Basic Metabolic Panel:  Recent Labs Lab 01/16/17 1409 01/16/17 1412  NA 143 143  K 3.8 3.8  CL 103 106  CO2 9*  --   GLUCOSE 176*  169*  BUN 8 7  CREATININE 1.46* 1.20  CALCIUM 10.1  --    GFR: CrCl cannot be calculated (Unknown ideal weight.). Liver Function Tests:  Recent Labs Lab 01/16/17 1409  AST 45*  ALT 21  ALKPHOS 69  BILITOT 0.2*  PROT 8.8*  ALBUMIN 4.5   No results for input(s): LIPASE, AMYLASE in the last 168 hours. No results for input(s): AMMONIA in the last 168 hours. Coagulation Profile: No results for input(s): INR, PROTIME in  the last 168 hours. Cardiac Enzymes: No results for input(s): CKTOTAL, CKMB, CKMBINDEX, TROPONINI in the last 168 hours. BNP (last 3 results) No results for input(s): PROBNP in the last 8760 hours. HbA1C: No results for input(s): HGBA1C in the last 72 hours. CBG: No results for input(s): GLUCAP in the last 168 hours. Lipid Profile: No results for input(s): CHOL, HDL, LDLCALC, TRIG, CHOLHDL, LDLDIRECT in the last 72 hours. Thyroid Function Tests: No results for input(s): TSH, T4TOTAL, FREET4, T3FREE, THYROIDAB in the last 72 hours. Anemia Panel: No results for input(s): VITAMINB12, FOLATE, FERRITIN, TIBC, IRON, RETICCTPCT in the last 72 hours. Urine analysis:    Component Value Date/Time   COLORURINE YELLOW 05/11/2014 2133   APPEARANCEUR CLEAR 05/11/2014 2133   LABSPEC 1.019 05/11/2014 2133   PHURINE 6.0 05/11/2014 2133   GLUCOSEU NEGATIVE 05/11/2014 2133   HGBUR NEGATIVE 05/11/2014 2133   BILIRUBINUR NEGATIVE 05/11/2014 2133   KETONESUR 15 (A) 05/11/2014 2133   PROTEINUR NEGATIVE 05/11/2014 2133   UROBILINOGEN 0.2 05/11/2014 2133   NITRITE NEGATIVE 05/11/2014 2133   LEUKOCYTESUR NEGATIVE 05/11/2014 2133   Sepsis Labs: @LABRCNTIP (procalcitonin:4,lacticidven:4) )No results found for this or any previous visit (from the past 240 hour(s)).   Radiological Exams on Admission: Dg Chest Portable 1 View  Result Date: 01/16/2017 CLINICAL DATA:  Shortness of breath and altered mental status. Alcohol abuse. EXAM: PORTABLE CHEST 1 VIEW COMPARISON:  None. FINDINGS: Limited low volume chest. Subtle asymmetric density at the left base, following the diaphragm. No edema, effusion, or pneumothorax. Normal heart size and mediastinal contours for technique. IMPRESSION: Limited low volume chest. Atelectasis, aspiration or pneumonia at the left base. Electronically Signed   By: Marnee SpringJonathon  Watts M.D.   On: 01/16/2017 16:10    EKG: Independently reviewed. Sinus tachycardia, rate  153  Assessment/Plan Sepsis possibly secondary to Aspiration pneumonia versus drug use -Patient is in with fever, tachycardia, tachypnea, elevated lactic acid -Chest x-ray showed possible aspiration in the left lung base -Patient was given azithromycin and ceftriaxone emergency department -Will order repeat chest x-ray for the morning after fluid resuscitation -Will place patient on Augmentin given possible aspiration -Blood cultures pending -Strep pneumonia urine antigen pending -Continue IV fluids  Acute encephalopathy, metabolic -Likely secondary to over use of illicit drugs including Molly, meth, cocaine, marijuana -Patient given IV fluid, encephalopathy has improved. Patient currently alert and oriented 3 -Will admit to stepdown for further monitoring -Conduct neuro checks  Polysubstance abuse including illicit drugs and alcohol -Will place on CIWA protocol, with banana bag, IV fluids -As above, admit to stepdown -Counseled on need for cessation -Drug tox screen pending -Social work consulted  Elevated lactic acid level -Upon admission to the emergency department, lactic acid over 17. Patient given IV fluid resuscitation, left casted 1.26 -Backed secondary to drug use  Acute kidney injury -Baseline creatinine 0.7-0.8, upon admission to the ER creatinine was 1.46 -Suspect secondary to dehydration and drug use -Continue IV fluids -Continue to monitor BMP  Left arm pain -Patient states he  is unable to move his arm, however has good grip strength. Feels he may have fallen on his arm prior to coming to the emergency department -Will obtain x-ray  DVT prophylaxis: Lovenox  Code Status: Full  Family Communication: None at bedside. Admission, patients condition and plan of care including tests being ordered have been discussed with the patient, who indicates understanding and agrees with the plan and Code Status.  Disposition Plan: Home when stable   Consults called:  None   Admission status: Inpatient   Time spent: 70 minutes  Deondra Labrador D.O. [facility] Pager [phone]  If 7PM-7AM, please contact night-coverage [email or web address] Password [facility] 01/16/2017, 5:42 PM

## 2017-01-16 NOTE — ED Provider Notes (Signed)
[facility]-EMERGENCY DEPT Provider Note   CSN: [id number] Arrival date & time: 01/16/17  1357     History   Chief Complaint Chief Complaint  Patient presents with  . Altered Mental Status    HPI Francisco Jacobs is a 36 y.o. male.  Pt presents to the ED with altered mental status.  The police found pt wandering around downtown confused.  They brought him here.  Pt is unable to give any hx.      Past Medical History:  Diagnosis Date  . Ankle fracture   . Asthma    as a child only  . ETOH abuse   . Migraine    "don't remember the last time I had one" (05/11/2014)    Patient Active Problem List   Diagnosis Date Noted  . Alcohol dependence with uncomplicated withdrawal (HCC) 04/07/2016  . 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 Bimalleolar fracture of left ankle 09/19/2014  . Polysubstance abuse 05/12/2014  . 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 Metabolic acidosis 05/11/2014  . Severe alcohol use disorder (HCC) 01/08/2014    Past Surgical History:  Procedure Laterality Date  . NO PAST SURGERIES    . ORIF FIBULA FRACTURE Left 09/19/2014   Procedure: OPEN REDUCTION INTERNAL FIXATION (ORIF) FIBULA FRACTURE, Open Reduction Internal Fixation Syndesmosis;  Surgeon: Eldred Manges, MD;  Location: [facility] OR;  Service: Orthopedics;  Laterality: Left;       Home Medications    Prior to Admission medications   Not on File    Family History No family history on file.  Social History Social History  Substance Use Topics  . Smoking status: Current Every Day Smoker    Packs/day: 0.50    Years: 20.00    Types: Cigarettes  . Smokeless tobacco: Never Used  . Alcohol use 63.0 oz/week    105 Cans of beer per week     Comment: daily 6 beers     Allergies   Patient has no known allergies.   Review of Systems Review of Systems  Unable to perform ROS: Mental status change     Physical Exam Updated Vital Signs BP  (!) 147/76   Pulse (!) 131   Temp (!) 101.2 F (38.4 C) (Rectal)   Resp (!) 23   SpO2 98%   Physical Exam  Constitutional: He appears distressed.  HENT:  Head: Normocephalic and atraumatic.  Right Ear: External ear normal.  Left Ear: External ear normal.  Nose: Nose normal.  Mouth/Throat: Mucous membranes are dry.  Eyes: Conjunctivae and EOM are normal. Pupils are equal, round, and reactive to light.  Neck: Normal range of motion. Neck supple.  Cardiovascular: Regular rhythm, normal heart sounds and intact distal pulses.  Tachycardia present.   Pulmonary/Chest: Breath sounds normal. Tachypnea noted.  Abdominal: Soft. Bowel sounds are normal.  Musculoskeletal: Normal range of motion.  Neurological:  Pt awake, but not answering questions.  He will follow some commands.  Skin: He is diaphoretic.  Psychiatric: He is noncommunicative.  Nursing note and vitals reviewed.    ED Treatments / Results  Labs (all labs ordered are listed, but only abnormal results are displayed) Labs Reviewed  COMPREHENSIVE METABOLIC PANEL - Abnormal; Notable for the following:       Result Value   CO2 9 (*)    Glucose, Bld 176 (*)    Creatinine, Ser 1.46 (*)    Total Protein 8.8 (*)    AST 45 (*)  Total Bilirubin 0.2 (*)    GFR calc non Af Amer 59 (*)    All other components within normal limits  LACTIC ACID, PLASMA - Abnormal; Notable for the following:    Lactic Acid, Venous 2.3 (*)    All other components within normal limits  I-STAT CG4 LACTIC ACID, ED - Abnormal; Notable for the following:    Lactic Acid, Venous >17.00 (*)    All other components within normal limits  I-STAT CHEM 8, ED - Abnormal; Notable for the following:    Glucose, Bld 169 (*)    All other components within normal limits  CULTURE, BLOOD (ROUTINE X 2)  CULTURE, BLOOD (ROUTINE X 2)  CBC WITH DIFFERENTIAL/PLATELET  RAPID URINE DRUG SCREEN, HOSP PERFORMED  URINALYSIS, ROUTINE W REFLEX MICROSCOPIC  I-STAT TROPOININ,  ED  I-STAT CG4 LACTIC ACID, ED    EKG  EKG Interpretation  Date/Time:  Saturday January 16 2017 14:03:19 EDT Ventricular Rate:  153 PR Interval:    QRS Duration: 77 QT Interval:  247 QTC Calculation: 394 R Axis:   -81 Text Interpretation:  Ectopic atrial tachycardia, unifocal Left atrial enlargement Left anterior fascicular block Probable LVH with secondary repol abnrm ST elevation, consider inferior injury st depression laterally No old tracing to compare Confirmed by Jacalyn LefevreHaviland, Maysen Bonsignore [phone]) on 01/16/2017 2:26:11 PM       Radiology Dg Chest Portable 1 View  Result Date: 01/16/2017 CLINICAL DATA:  Shortness of breath and altered mental status. Alcohol abuse. EXAM: PORTABLE CHEST 1 VIEW COMPARISON:  None. FINDINGS: Limited low volume chest. Subtle asymmetric density at the left base, following the diaphragm. No edema, effusion, or pneumothorax. Normal heart size and mediastinal contours for technique. IMPRESSION: Limited low volume chest. Atelectasis, aspiration or pneumonia at the left base. Electronically Signed   By: Marnee SpringJonathon  Watts M.D.   On: 01/16/2017 16:10    Procedures Procedures (including critical care time)  Medications Ordered in ED Medications  sodium chloride 0.9 % bolus 1,000 mL (0 mLs Intravenous Stopped 01/16/17 1507)    And  0.9 %  sodium chloride infusion ( Intravenous New Bag/Given 01/16/17 1529)  cefTRIAXone (ROCEPHIN) 1 g in dextrose 5 % 50 mL IVPB (not administered)  azithromycin (ZITHROMAX) 500 mg in dextrose 5 % 250 mL IVPB (not administered)  sodium chloride 0.9 % bolus 1,000 mL (not administered)  LORazepam (ATIVAN) injection 1 mg (not administered)  LORazepam (ATIVAN) injection 1 mg (1 mg Intravenous Given 01/16/17 1411)  sodium chloride 0.9 % bolus 1,000 mL (1,000 mLs Intravenous New Bag/Given 01/16/17 1440)  LORazepam (ATIVAN) injection 1 mg (1 mg Intravenous Given 01/16/17 1441)  sodium chloride 0.9 % bolus 1,000 mL (1,000 mLs Intravenous New Bag/Given  01/16/17 1525)  acetaminophen (TYLENOL) suppository 650 mg (650 mg Rectal Given 01/16/17 1534)     Initial Impression / Assessment and Plan / ED Course  I have reviewed the triage vital signs and the nursing notes.  Pertinent labs & imaging results that were available during my care of the patient were reviewed by me and considered in my medical decision making (see chart for details).   Pt is much more alert and is able to tell me what happened today.  He said he used some Molly that had some meth in it.  He looks much more comfortable.  CRITICAL CARE Performed by: Jacalyn LefevreJulie Hadley Detloff   Total critical care time: 30 minutes  Critical care time was exclusive of separately billable procedures and treating other patients.  Critical care  was necessary to treat or prevent imminent or life-threatening deterioration.  Critical care was time spent personally by me on the following activities: development of treatment plan with patient and/or surrogate as well as nursing, discussions with consultants, evaluation of patient's response to treatment, examination of patient, obtaining history from patient or surrogate, ordering and performing treatments and interventions, ordering and review of laboratory studies, ordering and review of radiographic studies, pulse oximetry and re-evaluation of patient's condition.  I think the fever is likely from [place] and meth.  Lactic acid is much improved after IVFs.  HR and mental status has also improved.  Pt d/w Dr. Catha Gosselin (triad) who will see him in the ED.  Final Clinical Impressions(s) / ED Diagnoses   Final diagnoses:  Polysubstance abuse  Dehydration  Substance-induced delirium (HCC)  Fever, unspecified fever cause  Community acquired pneumonia of left lower lobe of lung (HCC)    New Prescriptions New Prescriptions   No medications on file     Jacalyn Lefevre, MD 01/16/17 1720

## 2017-01-16 NOTE — ED Notes (Signed)
RN Dorene GrebeNatalie and EDP Haviland notofoed of abnormal CG4 result <17, AnGap 32, and Glucose 169

## 2017-01-16 NOTE — ED Notes (Signed)
Patient stuck multiple times by this RN and Alexis NT without success for blood specimen.

## 2017-01-16 NOTE — ED Notes (Signed)
Called [facility] to give report. Was told that patient acuity level has changed so will not be going to their floor.   New Bed assignment now re-submitted.

## 2017-01-16 NOTE — ED Notes (Signed)
Bladder scan revealed no urine for patient. Patient's pants soaked on arrival to ED. Intervention: explained to patient need for urine, explained that if urine was not obtained RN staff would need to perform catheterization. Plan: pt agreed to inform staff of need to urinate to avoid catheterization. 2,000 mg IV bolus initiated. Will re-assess in 15 minutes.

## 2017-01-16 NOTE — ED Notes (Signed)
After several sticks for blood cultures, only able to get one set of 2ml.

## 2017-01-17 ENCOUNTER — Inpatient Hospital Stay (HOSPITAL_COMMUNITY): Payer: Self-pay

## 2017-01-17 DIAGNOSIS — F1910 Other psychoactive substance abuse, uncomplicated: Secondary | ICD-10-CM

## 2017-01-17 DIAGNOSIS — J189 Pneumonia, unspecified organism: Secondary | ICD-10-CM

## 2017-01-17 DIAGNOSIS — R509 Fever, unspecified: Secondary | ICD-10-CM

## 2017-01-17 DIAGNOSIS — E860 Dehydration: Secondary | ICD-10-CM

## 2017-01-17 DIAGNOSIS — J181 Lobar pneumonia, unspecified organism: Secondary | ICD-10-CM

## 2017-01-17 DIAGNOSIS — E876 Hypokalemia: Secondary | ICD-10-CM

## 2017-01-17 DIAGNOSIS — F19921 Other psychoactive substance use, unspecified with intoxication with delirium: Secondary | ICD-10-CM

## 2017-01-17 DIAGNOSIS — N189 Chronic kidney disease, unspecified: Secondary | ICD-10-CM

## 2017-01-17 DIAGNOSIS — R52 Pain, unspecified: Secondary | ICD-10-CM

## 2017-01-17 LAB — CBC
HCT: 38 % — ABNORMAL LOW (ref 39.0–52.0)
Hemoglobin: 12.9 g/dL — ABNORMAL LOW (ref 13.0–17.0)
MCH: 31.5 pg (ref 26.0–34.0)
MCHC: 33.9 g/dL (ref 30.0–36.0)
MCV: 92.7 fL (ref 78.0–100.0)
PLATELETS: 216 10*3/uL (ref 150–400)
RBC: 4.1 MIL/uL — AB (ref 4.22–5.81)
RDW: 12.7 % (ref 11.5–15.5)
WBC: 13.3 10*3/uL — ABNORMAL HIGH (ref 4.0–10.5)

## 2017-01-17 LAB — BASIC METABOLIC PANEL
Anion gap: 9 (ref 5–15)
BUN: 10 mg/dL (ref 6–20)
CALCIUM: 8.6 mg/dL — AB (ref 8.9–10.3)
CO2: 23 mmol/L (ref 22–32)
CREATININE: 1.05 mg/dL (ref 0.61–1.24)
Chloride: 107 mmol/L (ref 101–111)
GFR calc non Af Amer: >60 mL/min (ref >60)
Glucose, Bld: 86 mg/dL (ref 65–99)
Potassium: 3.1 mmol/L — ABNORMAL LOW (ref 3.5–5.1)
Sodium: 139 mmol/L (ref 135–145)

## 2017-01-17 LAB — STREP PNEUMONIAE URINARY ANTIGEN: Strep Pneumo Urinary Antigen: NEGATIVE

## 2017-01-17 LAB — HIV ANTIBODY (ROUTINE TESTING W REFLEX): HIV SCREEN 4TH GENERATION: NONREACTIVE

## 2017-01-17 LAB — MAGNESIUM: MAGNESIUM: 2.4 mg/dL (ref 1.7–2.4)

## 2017-01-17 MED ORDER — POTASSIUM CHLORIDE 10 MEQ/100ML IV SOLN
10.0000 meq | Freq: Once | INTRAVENOUS | Status: AC
  Administered 2017-01-17: 10 meq via INTRAVENOUS
  Filled 2017-01-17: qty 100

## 2017-01-17 MED ORDER — ORAL CARE MOUTH RINSE
15.0000 mL | Freq: Two times a day (BID) | OROMUCOSAL | Status: DC
  Administered 2017-01-18 – 2017-01-19 (×2): 15 mL via OROMUCOSAL

## 2017-01-17 MED ORDER — SODIUM CHLORIDE 0.9 % IV SOLN
3.0000 g | Freq: Four times a day (QID) | INTRAVENOUS | Status: DC
  Administered 2017-01-17 – 2017-01-18 (×2): 3 g via INTRAVENOUS
  Filled 2017-01-17 (×3): qty 3

## 2017-01-17 MED ORDER — HALOPERIDOL LACTATE 5 MG/ML IJ SOLN
2.0000 mg | Freq: Once | INTRAMUSCULAR | Status: AC
  Administered 2017-01-17: 2 mg via INTRAVENOUS

## 2017-01-17 MED ORDER — SODIUM CHLORIDE 0.9 % IV SOLN
0.0000 ug/kg/h | INTRAVENOUS | Status: DC
  Administered 2017-01-17: 0.2 ug/kg/h via INTRAVENOUS
  Administered 2017-01-17: 0.7 ug/kg/h via INTRAVENOUS
  Administered 2017-01-18: 0.5 ug/kg/h via INTRAVENOUS
  Administered 2017-01-18: 0.4 ug/kg/h via INTRAVENOUS
  Administered 2017-01-18: 0.3 ug/kg/h via INTRAVENOUS
  Administered 2017-01-18: 0.6 ug/kg/h via INTRAVENOUS
  Administered 2017-01-18: 0.4 ug/kg/h via INTRAVENOUS
  Filled 2017-01-17 (×8): qty 2

## 2017-01-17 MED ORDER — HALOPERIDOL LACTATE 5 MG/ML IJ SOLN
INTRAMUSCULAR | Status: AC
  Filled 2017-01-17: qty 1

## 2017-01-17 MED ORDER — HALOPERIDOL LACTATE 5 MG/ML IJ SOLN
2.0000 mg | Freq: Four times a day (QID) | INTRAMUSCULAR | Status: DC | PRN
  Administered 2017-01-17: 2 mg via INTRAVENOUS
  Filled 2017-01-17: qty 1

## 2017-01-17 MED ORDER — POTASSIUM CHLORIDE CRYS ER 20 MEQ PO TBCR
40.0000 meq | EXTENDED_RELEASE_TABLET | Freq: Two times a day (BID) | ORAL | Status: AC
  Administered 2017-01-17: 40 meq via ORAL
  Filled 2017-01-17: qty 2

## 2017-01-17 NOTE — Progress Notes (Signed)
Patient getting increasing agitated as the shift progressed, he is having both visual and auditory hallucinations. Symptom not relived with ordered Lorazepam. He became defiant, and agitated insisting on going out of his room, was becoming difficult to redirect. MD notified, order for 2 mg Haldol received and administered. Will continue to monitor.

## 2017-01-17 NOTE — Consult Note (Signed)
Reason for Consult:eval to see if patient needs precedex  Francisco Jacobs is an 36 y.o. male.  HPI:   Francisco Jacobs is a 36 y.o. male with a medical history of polysubstance abuse, alcohol abuse, who presented to the emergency department with altered mental status. Patient was found by police wandering downtown earlier today with confusion. He was brought in and found to be very dehydrated. Upon my examination, patient was alert and oriented, no longer confused. Patient states he has been doing meth, Molly, cocaine, marijuana, and drinking ETOH. Currently denies any chest pain, shortness of breath, abdominal pain, nausea or vomiting, diarrhea or constipation, cough, fever, recent illness or sick contacts, recent travel.  ED Course: Found to have an elevated lactic acid on admission, patient was very disoriented and confused. Given IV fluids and resuscitated.   Today morning - he was found to be hallucinating so consult called for precedex initiation.  Currently he is asymptommatic and behaving pretty well. Keeps asking for his wallet. Denies seeing or hearing things.     Past Medical History:  Diagnosis Date  . Ankle fracture   . Asthma    as a child only  . ETOH abuse   . Migraine    "don't remember the last time I had one" (05/11/2014)    Past Surgical History:  Procedure Laterality Date  . NO PAST SURGERIES    . ORIF FIBULA FRACTURE Left 09/19/2014   Procedure: OPEN REDUCTION INTERNAL FIXATION (ORIF) FIBULA FRACTURE, Open Reduction Internal Fixation Syndesmosis;  Surgeon: Mark C Yates, MD;  Location: [facility] OR;  Service: Orthopedics;  Laterality: Left;    No family history on file.  Social History:  reports that he has been smoking Cigarettes.  He has a 10.00 pack-year smoking history. He has never used smokeless tobacco. He reports that he drinks about 63.0 oz of alcohol per week . He reports that he uses drugs, including Cocaine and Marijuana, about 7 times per week.  Allergies: No  Known Allergies  Medications: I have reviewed the patient's current medications.  Results for orders placed or performed during the hospital encounter of 01/16/17 (from the past 48 hour(s))  CBC WITH DIFFERENTIAL     Status: None   Collection Time: 01/16/17  2:09 PM  Result Value Ref Range   WBC 6.6 4.0 - 10.5 K/uL   RBC 4.60 4.22 - 5.81 MIL/uL   Hemoglobin 14.8 13.0 - 17.0 g/dL   HCT 45.3 39.0 - 52.0 %   MCV 98.5 78.0 - 100.0 fL   MCH 32.2 26.0 - 34.0 pg   MCHC 32.7 30.0 - 36.0 g/dL   RDW 12.5 11.5 - 15.5 %   Platelets 278 150 - 400 K/uL   Neutrophils Relative % 54 %   Neutro Abs 3.6 1.7 - 7.7 K/uL   Lymphocytes Relative 41 %   Lymphs Abs 2.7 0.7 - 4.0 K/uL   Monocytes Relative 3 %   Monocytes Absolute 0.2 0.1 - 1.0 K/uL   Eosinophils Relative 1 %   Eosinophils Absolute 0.1 0.0 - 0.7 K/uL   Basophils Relative 1 %   Basophils Absolute 0.0 0.0 - 0.1 K/uL  Comprehensive metabolic panel     Status: Abnormal   Collection Time: 01/16/17  2:09 PM  Result Value Ref Range   Sodium 143 135 - 145 mmol/L   Potassium 3.8 3.5 - 5.1 mmol/L   Chloride 103 101 - 111 mmol/L   CO2 9 (L) 22 - 32 mmol/L   Glucose,   Bld 176 (H) 65 - 99 mg/dL   BUN 8 6 - 20 mg/dL   Creatinine, Ser 1.46 (H) 0.61 - 1.24 mg/dL   Calcium 10.1 8.9 - 10.3 mg/dL   Total Protein 8.8 (H) 6.5 - 8.1 g/dL   Albumin 4.5 3.5 - 5.0 g/dL   AST 45 (H) 15 - 41 U/L   ALT 21 17 - 63 U/L   Alkaline Phosphatase 69 38 - 126 U/L   Total Bilirubin 0.2 (L) 0.3 - 1.2 mg/dL   GFR calc non Af Amer 59 (L) >60 mL/min   GFR calc Af Amer >60 >60 mL/min    Comment: (NOTE) The eGFR has been calculated using the CKD EPI equation. This calculation has not been validated in all clinical situations. eGFR's persistently <60 mL/min signify possible Chronic Kidney Disease.   I-stat troponin, ED     Status: None   Collection Time: 01/16/17  2:10 PM  Result Value Ref Range   Troponin i, poc 0.01 0.00 - 0.08 ng/mL   Comment 3             Comment: Due to the release kinetics of cTnI, a negative result within the first hours of the onset of symptoms does not rule out myocardial infarction with certainty. If myocardial infarction is still suspected, repeat the test at appropriate intervals.   I-Stat CG4 Lactic Acid, ED     Status: Abnormal   Collection Time: 01/16/17  2:12 PM  Result Value Ref Range   Lactic Acid, Venous >17.00 (HH) 0.5 - 1.9 mmol/L   Comment NOTIFIED PHYSICIAN   I-stat Chem 8, ED     Status: Abnormal   Collection Time: 01/16/17  2:12 PM  Result Value Ref Range   Sodium 143 135 - 145 mmol/L   Potassium 3.8 3.5 - 5.1 mmol/L   Chloride 106 101 - 111 mmol/L   BUN 7 6 - 20 mg/dL   Creatinine, Ser 1.20 0.61 - 1.24 mg/dL   Glucose, Bld 169 (H) 65 - 99 mg/dL   Calcium, Ion 1.18 1.15 - 1.40 mmol/L   TCO2 10 0 - 100 mmol/L   Hemoglobin 16.3 13.0 - 17.0 g/dL   HCT 48.0 39.0 - 52.0 %   Comment NOTIFIED PHYSICIAN   Lactic acid, plasma     Status: Abnormal   Collection Time: 01/16/17  4:10 PM  Result Value Ref Range   Lactic Acid, Venous 2.3 (HH) 0.5 - 1.9 mmol/L    Comment: CRITICAL RESULT CALLED TO, READ BACK BY AND VERIFIED WITH: DAVIS,N AT 5:15PM ON 01/16/17 BY FESTERMAN,C   I-Stat CG4 Lactic Acid, ED     Status: None   Collection Time: 01/16/17  5:11 PM  Result Value Ref Range   Lactic Acid, Venous 1.26 0.5 - 1.9 mmol/L  Urine rapid drug screen (hosp performed)not at [facility]     Status: Abnormal   Collection Time: 01/16/17  5:35 PM  Result Value Ref Range   Opiates NONE DETECTED NONE DETECTED   Cocaine POSITIVE (A) NONE DETECTED   Benzodiazepines NONE DETECTED NONE DETECTED   Amphetamines NONE DETECTED NONE DETECTED   Tetrahydrocannabinol POSITIVE (A) NONE DETECTED   Barbiturates NONE DETECTED NONE DETECTED    Comment:        DRUG SCREEN FOR MEDICAL PURPOSES ONLY.  IF CONFIRMATION IS NEEDED FOR ANY PURPOSE, NOTIFY LAB WITHIN 5 DAYS.        LOWEST DETECTABLE LIMITS FOR URINE DRUG SCREEN Drug  Class  Cutoff (ng/mL) Amphetamine      1000 Barbiturate      200 Benzodiazepine   941 Tricyclics       740 Opiates          300 Cocaine          300 THC              50   Urinalysis, Routine w reflex microscopic     Status: Abnormal   Collection Time: 01/16/17  5:35 PM  Result Value Ref Range   Color, Urine YELLOW YELLOW   APPearance HAZY (A) CLEAR   Specific Gravity, Urine 1.025 1.005 - 1.030   pH 6.5 5.0 - 8.0   Glucose, UA NEGATIVE NEGATIVE mg/dL   Hgb urine dipstick LARGE (A) NEGATIVE   Bilirubin Urine NEGATIVE NEGATIVE   Ketones, ur NEGATIVE NEGATIVE mg/dL   Protein, ur 30 (A) NEGATIVE mg/dL   Nitrite NEGATIVE NEGATIVE   Leukocytes, UA SMALL (A) NEGATIVE  Blood culture (routine x 2)     Status: None (Preliminary result)   Collection Time: 01/16/17  5:35 PM  Result Value Ref Range   Specimen Description BLOOD RIGHT ANTECUBITAL    Special Requests IN PEDIATRIC BOTTLE Blood Culture adequate volume    Culture PENDING    Report Status PENDING   Urinalysis, Microscopic (reflex)     Status: None   Collection Time: 01/16/17  5:35 PM  Result Value Ref Range   RBC / HPF 0-5 0 - 5 RBC/hpf   WBC, UA TOO NUMEROUS TO COUNT 0 - 5 WBC/hpf   Bacteria, UA NONE SEEN NONE SEEN   Squamous Epithelial / LPF NONE SEEN NONE SEEN   Mucous PRESENT    Urine-Other SPERM PRESENT   Procalcitonin     Status: None   Collection Time: 01/16/17  8:25 PM  Result Value Ref Range   Procalcitonin 1.27 ng/mL    Comment:        Interpretation: PCT > 0.5 ng/mL and <= 2 ng/mL: Systemic infection (sepsis) is possible, but other conditions are known to elevate PCT as well. (NOTE)         ICU PCT Algorithm               Non ICU PCT Algorithm    ----------------------------     ------------------------------         PCT < 0.25 ng/mL                 PCT < 0.1 ng/mL     Stopping of antibiotics            Stopping of antibiotics       strongly encouraged.               strongly encouraged.     ----------------------------     ------------------------------       PCT level decrease by               PCT < 0.25 ng/mL       >= 80% from peak PCT       OR PCT 0.25 - 0.5 ng/mL          Stopping of antibiotics                                             encouraged.     Stopping of  antibiotics           encouraged.    ----------------------------     ------------------------------       PCT level decrease by              PCT >= 0.25 ng/mL       < 80% from peak PCT        AND PCT >= 0.5 ng/mL             Continuing antibiotics                                              encouraged.       Continuing antibiotics            encouraged.    ----------------------------     ------------------------------     PCT level increase compared          PCT > 0.5 ng/mL         with peak PCT AND          PCT >= 0.5 ng/mL             Escalation of antibiotics                                          strongly encouraged.      Escalation of antibiotics        strongly encouraged.   Protime-INR     Status: Abnormal   Collection Time: 01/16/17  8:25 PM  Result Value Ref Range   Prothrombin Time 15.9 (H) 11.4 - 15.2 seconds   INR 1.26   APTT     Status: None   Collection Time: 01/16/17  8:25 PM  Result Value Ref Range   aPTT 26 24 - 36 seconds  Basic metabolic panel     Status: Abnormal   Collection Time: 01/17/17  3:42 AM  Result Value Ref Range   Sodium 139 135 - 145 mmol/L   Potassium 3.1 (L) 3.5 - 5.1 mmol/L    Comment: DELTA CHECK NOTED   Chloride 107 101 - 111 mmol/L   CO2 23 22 - 32 mmol/L   Glucose, Bld 86 65 - 99 mg/dL   BUN 10 6 - 20 mg/dL   Creatinine, Ser 1.05 0.61 - 1.24 mg/dL   Calcium 8.6 (L) 8.9 - 10.3 mg/dL   GFR calc non Af Amer >60 >60 mL/min   GFR calc Af Amer >60 >60 mL/min    Comment: (NOTE) The eGFR has been calculated using the CKD EPI equation. This calculation has not been validated in all clinical situations. eGFR's persistently <60 mL/min signify possible  Chronic Kidney Disease.    Anion gap 9 5 - 15  CBC     Status: Abnormal   Collection Time: 01/17/17  3:42 AM  Result Value Ref Range   WBC 13.3 (H) 4.0 - 10.5 K/uL   RBC 4.10 (L) 4.22 - 5.81 MIL/uL   Hemoglobin 12.9 (L) 13.0 - 17.0 g/dL    Comment: DELTA CHECK NOTED REPEATED TO VERIFY    HCT 38.0 (L) 39.0 - 52.0 %   MCV 92.7 78.0 - 100.0 fL   MCH 31.5 26.0 - 34.0 pg   MCHC 33.9 30.0 - 36.0 g/dL  RDW 12.7 11.5 - 15.5 %   Platelets 216 150 - 400 K/uL  Magnesium     Status: None   Collection Time: 01/17/17  3:42 AM  Result Value Ref Range   Magnesium 2.4 1.7 - 2.4 mg/dL    Dg Elbow 2 Views Left  Result Date: 01/16/2017 CLINICAL DATA:  Pain, altered mental status EXAM: LEFT ELBOW - 2 VIEW COMPARISON:  None. FINDINGS: There is no evidence of fracture, dislocation, or joint effusion. There is no evidence of arthropathy or other focal bone abnormality. Soft tissues are unremarkable. IMPRESSION: Negative. Electronically Signed   By: Donavan Foil M.D.   On: 01/16/2017 18:35   Dg Forearm Left  Result Date: 01/16/2017 CLINICAL DATA:  Pain, altered mental status EXAM: LEFT FOREARM - 2 VIEW COMPARISON:  None. FINDINGS: There is no evidence of fracture or other focal bone lesions. Soft tissues are unremarkable. IMPRESSION: No acute osseous abnormality. Electronically Signed   By: Donavan Foil M.D.   On: 01/16/2017 18:36   Dg Chest Portable 1 View  Result Date: 01/16/2017 CLINICAL DATA:  Shortness of breath and altered mental status. Alcohol abuse. EXAM: PORTABLE CHEST 1 VIEW COMPARISON:  None. FINDINGS: Limited low volume chest. Subtle asymmetric density at the left base, following the diaphragm. No edema, effusion, or pneumothorax. Normal heart size and mediastinal contours for technique. IMPRESSION: Limited low volume chest. Atelectasis, aspiration or pneumonia at the left base. Electronically Signed   By: Monte Fantasia M.D.   On: 01/16/2017 16:10   Dg Humerus Left  Result Date:  01/16/2017 CLINICAL DATA:  Pain, altered mental status does EXAM: LEFT HUMERUS - 2+ VIEW COMPARISON:  None. FINDINGS: There is no evidence of fracture or other focal bone lesions. Soft tissues are unremarkable. IMPRESSION: Negative. Electronically Signed   By: Donavan Foil M.D.   On: 01/16/2017 18:35    Review of Systems  All other systems reviewed and are negative.  Blood pressure (!) 151/91, pulse 84, temperature 97.7 F (36.5 C), temperature source Oral, resp. rate 17, weight 102.5 kg (225 lb 15.5 oz), SpO2 100 %. Physical Exam  Nursing note and vitals reviewed. Constitutional: He is oriented to person, place, and time. He appears well-developed and well-nourished. No distress.  HENT:  Head: Normocephalic and atraumatic.  Right Ear: External ear normal.  Left Ear: External ear normal.  Mouth/Throat: Oropharynx is clear and moist. No oropharyngeal exudate.  Eyes: EOM are normal. Pupils are equal, round, and reactive to light. Right eye exhibits no discharge. Left eye exhibits no discharge. No scleral icterus.  Neck: Normal range of motion. Neck supple. No JVD present. No tracheal deviation present. No thyromegaly present.  Cardiovascular: Normal rate, regular rhythm and normal heart sounds.  Exam reveals no gallop and no friction rub.   No murmur heard. Respiratory: Effort normal and breath sounds normal. No respiratory distress. He has no wheezes. He has no rales. He exhibits no tenderness.  GI: Soft. Bowel sounds are normal. He exhibits no distension. There is no tenderness. There is no rebound.  Musculoskeletal: Normal range of motion. He exhibits no edema, tenderness or deformity.  Neurological: He is alert and oriented to person, place, and time. He displays normal reflexes. No cranial nerve deficit. He exhibits normal muscle tone. Coordination normal.  Skin: Skin is warm. He is not diaphoretic.  Psychiatric: His behavior is normal.    Assessment/Plan: Patient Active Problem List    Diagnosis Date Noted  . Dehydration   . Substance-induced delirium ([place])   .  Fever   . Community acquired pneumonia of left lower lobe of lung ([place])   . Pain   . Sepsis ([place]) 01/16/2017  . Alcohol dependence with uncomplicated withdrawal ([place]) 04/07/2016  . 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 Bimalleolar fracture of left ankle 09/19/2014  . Polysubstance abuse 05/12/2014  . 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 Metabolic acidosis 15/83/0940  . Severe alcohol use disorder ([place]) 01/08/2014    Plan: CIWA protocol Ok to try precedex. I dont think he has pneumonia based on his recent most CXR Would consider stopping antibiotics if procalcitonin and wbc trend down. AKi resolved    Sharia Reeve 01/17/2017, 8:36 AM

## 2017-01-17 NOTE — Progress Notes (Signed)
PROGRESS NOTE    Francisco Jacobs  ZOX:[id number] DOB: [birth year] DOA: 01/16/2017 PCP: Lavinia Sharps, NP   Chief Complaint  Patient presents with  . Altered Mental Status    Brief Narrative:  HPI on 01/16/2017  Francisco Jacobs is a 36 y.o. male with a medical history of polysubstance abuse, alcohol abuse, who presented to the emergency department with altered mental status. Patient was found by police wandering downtown earlier today with confusion. He was brought in and found to be very dehydrated. Upon my examination, patient was alert and oriented, no longer confused. Patient states he has been doing meth, Molly, cocaine, marijuana, and drinking. Patient admits to drinking 212 packs of beer per day. Currently denies any chest pain, shortness of breath, abdominal pain, nausea or vomiting, diarrhea or constipation, cough, fever, recent illness or sick contacts, recent travel.  Assessment & Plan   Sepsis possibly secondary to possible pneumonia vs drug use -Patient is in with fever, tachycardia, tachypnea, elevated lactic acid, all have resolved -patient now with leukocytosis, which is likely reactive to withdrawal and not actual infection as procalcitonin is 1.27 -Chest x-ray showed possible aspiration in the left lung base -Patient was given 1 dose of azithromycin and ceftriaxone emergency department. Was placed on Augmentin for possible aspiration. -Repeat chest x-ray this morning shows no infiltrate. Will discontinue Augmentin. Pro-calcitonin as above. -Blood cultures Show no growth to date -Strep pneumonia urine antigen negative -Continue IV fluids  Acute encephalopathy, metabolic -Likely secondary to over use of illicit drugs including Molly, meth, cocaine, marijuana -Patient given IV fluid, encephalopathy has improved. -Upon admission, patient was alert and oriented 3 however this morning, he is confused. -Continue neuro checks  Polysubstance abuse including illicit drugs and  alcohol -Continue CIWA protocol, with banana bag, IV fluids -Overnight, patient became increasing agitated and required several doses of Haldol as well as Ativan -PCCM consulted and appreciated for possible Precedex drip -Counseled on need for cessation -Drug tox screen positive for cocaine and marijuana -Social work consulted  Elevated lactic acid level -Upon admission to the emergency department, lactic acid over 17. Patient given IV fluid resuscitation, lactic resolved to 1.26 -Likely secondary to drug use  Acute kidney injury -Baseline creatinine 0.7-0.8, upon admission to the ER creatinine was 1.46 -Suspect secondary to dehydration and drug use -Creatinine now 1.05 -Continue IV fluids -Continue to monitor BMP  Left arm pain -Patient states he is unable to move his arm, however has good grip strength. Feels he may have fallen on his arm prior to coming to the emergency department -Xrays obtained and unremarkable   Hypokalemia -Will replace and continue to monitor BMP -Magnesium obtained, 2.4  DVT Prophylaxis  Lovenox  Code Status: Full  Family Communication: None bedside  Disposition Plan: Admitted. Will transfer care to PCCM given Precedex drip. Will sign off and reassume care when patient has improved and no longer needed precedex  Consultants PCCM  Procedures  None  Antibiotics   Anti-infectives    Start     Dose/Rate Route Frequency Ordered Stop   01/16/17 2200  amoxicillin-clavulanate (AUGMENTIN) 875-125 MG per tablet 1 tablet     1 tablet Oral Every 12 hours 01/16/17 1944     01/16/17 1645  cefTRIAXone (ROCEPHIN) 1 g in dextrose 5 % 50 mL IVPB     1 g 100 mL/hr over 30 Minutes Intravenous  Once 01/16/17 1637 01/16/17 1917   01/16/17 1645  azithromycin (ZITHROMAX) 500 mg in dextrose 5 % 250 mL IVPB  500 mg 250 mL/hr over 60 Minutes Intravenous  Once 01/16/17 1637 01/16/17 2025      Subjective:   Francisco Jacobs seen and examined today.  Patient  appears confused this morning. Does not recall the events occurred yesterday. Currently denies chest pain or shortness of breath. Feels that he is able to leave the hospital and come back for treatment.  Objective:   Vitals:   01/17/17 0000 01/17/17 0234 01/17/17 0500 01/17/17 0600  BP: (!) 145/89 (!) 153/98  (!) 151/91  Pulse: 91   84  Resp: (!) 28 (!) 23  17  Temp:      TempSrc:      SpO2: 100% 100%  100%  Weight:   102.5 kg (225 lb 15.5 oz)     Intake/Output Summary (Last 24 hours) at 01/17/17 1029 Last data filed at 01/17/17 0600  Gross per 24 hour  Intake             2850 ml  Output              250 ml  Net             2600 ml   Filed Weights   01/17/17 0500  Weight: 102.5 kg (225 lb 15.5 oz)    Exam  General: Well developed, well nourished, NAD, appears stated age  HEENT: NCAT, mucous membranes moist.   Cardiovascular: S1 S2 auscultated, no rubs, murmurs or gallops. Regular rate and rhythm.  Respiratory: Clear to auscultation bilaterally with equal chest rise  Abdomen: Soft, nontender, nondistended, + bowel sounds  Extremities: warm dry without cyanosis clubbing or edema  Neuro: AAOx2 (self, place), otherwise nonfocal  Psych: Unable to fully assess, appears confused. Currently not combative.    Data Reviewed: I have personally reviewed following labs and imaging studies  CBC:  Recent Labs Lab 01/16/17 1409 01/16/17 1412 01/17/17 0342  WBC 6.6  --  13.3*  NEUTROABS 3.6  --   --   HGB 14.8 16.3 12.9*  HCT 45.3 48.0 38.0*  MCV 98.5  --  92.7  PLT 278  --  216   Basic Metabolic Panel:  Recent Labs Lab 01/16/17 1409 01/16/17 1412 01/17/17 0342  NA 143 143 139  K 3.8 3.8 3.1*  CL 103 106 107  CO2 9*  --  23  GLUCOSE 176* 169* 86  BUN 8 7 10   CREATININE 1.46* 1.20 1.05  CALCIUM 10.1  --  8.6*  MG  --   --  2.4   GFR: Estimated Creatinine Clearance: 116.3 mL/min (by C-G formula based on SCr of 1.05 mg/dL). Liver Function Tests:  Recent  Labs Lab 01/16/17 1409  AST 45*  ALT 21  ALKPHOS 69  BILITOT 0.2*  PROT 8.8*  ALBUMIN 4.5   No results for input(s): LIPASE, AMYLASE in the last 168 hours. No results for input(s): AMMONIA in the last 168 hours. Coagulation Profile:  Recent Labs Lab 01/16/17 2025  INR 1.26   Cardiac Enzymes: No results for input(s): CKTOTAL, CKMB, CKMBINDEX, TROPONINI in the last 168 hours. BNP (last 3 results) No results for input(s): PROBNP in the last 8760 hours. HbA1C: No results for input(s): HGBA1C in the last 72 hours. CBG: No results for input(s): GLUCAP in the last 168 hours. Lipid Profile: No results for input(s): CHOL, HDL, LDLCALC, TRIG, CHOLHDL, LDLDIRECT in the last 72 hours. Thyroid Function Tests: No results for input(s): TSH, T4TOTAL, FREET4, T3FREE, THYROIDAB in the last 72 hours. Anemia Panel:  No results for input(s): VITAMINB12, FOLATE, FERRITIN, TIBC, IRON, RETICCTPCT in the last 72 hours. Urine analysis:    Component Value Date/Time   COLORURINE YELLOW 01/16/2017 1735   APPEARANCEUR HAZY (A) 01/16/2017 1735   LABSPEC 1.025 01/16/2017 1735   PHURINE 6.5 01/16/2017 1735   GLUCOSEU NEGATIVE 01/16/2017 1735   HGBUR LARGE (A) 01/16/2017 1735   BILIRUBINUR NEGATIVE 01/16/2017 1735   KETONESUR NEGATIVE 01/16/2017 1735   PROTEINUR 30 (A) 01/16/2017 1735   UROBILINOGEN 0.2 05/11/2014 2133   NITRITE NEGATIVE 01/16/2017 1735   LEUKOCYTESUR SMALL (A) 01/16/2017 1735   Sepsis Labs: @LABRCNTIP (procalcitonin:4,lacticidven:4)  ) Recent Results (from the past 240 hour(s))  Blood culture (routine x 2)     Status: None (Preliminary result)   Collection Time: 01/16/17  5:35 PM  Result Value Ref Range Status   Specimen Description BLOOD RIGHT ANTECUBITAL  Final   Special Requests IN PEDIATRIC BOTTLE Blood Culture adequate volume  Final   Culture   Final    NO GROWTH < 12 HOURS Performed at [facility], [address], [place], [place] [address]    Report Status  PENDING  Incomplete  Blood culture (routine x 2)     Status: None (Preliminary result)   Collection Time: 01/16/17  8:25 PM  Result Value Ref Range Status   Specimen Description BLOOD LEFT ANTECUBITAL  Final   Special Requests   Final    BOTTLES DRAWN AEROBIC AND ANAEROBIC Blood Culture adequate volume   Culture   Final    NO GROWTH < 12 HOURS Performed at [facility], [address], [place], [place] [address]    Report Status PENDING  Incomplete      Radiology Studies: Dg Elbow 2 Views Left  Result Date: 01/16/2017 CLINICAL DATA:  Pain, altered mental status EXAM: LEFT ELBOW - 2 VIEW COMPARISON:  None. FINDINGS: There is no evidence of fracture, dislocation, or joint effusion. There is no evidence of arthropathy or other focal bone abnormality. Soft tissues are unremarkable. IMPRESSION: Negative. Electronically Signed   By: Jasmine Pang M.D.   On: 01/16/2017 18:35   Dg Forearm Left  Result Date: 01/16/2017 CLINICAL DATA:  Pain, altered mental status EXAM: LEFT FOREARM - 2 VIEW COMPARISON:  None. FINDINGS: There is no evidence of fracture or other focal bone lesions. Soft tissues are unremarkable. IMPRESSION: No acute osseous abnormality. Electronically Signed   By: Jasmine Pang M.D.   On: 01/16/2017 18:36   Dg Chest Portable 1 View  Result Date: 01/16/2017 CLINICAL DATA:  Shortness of breath and altered mental status. Alcohol abuse. EXAM: PORTABLE CHEST 1 VIEW COMPARISON:  None. FINDINGS: Limited low volume chest. Subtle asymmetric density at the left base, following the diaphragm. No edema, effusion, or pneumothorax. Normal heart size and mediastinal contours for technique. IMPRESSION: Limited low volume chest. Atelectasis, aspiration or pneumonia at the left base. Electronically Signed   By: Marnee Spring M.D.   On: 01/16/2017 16:10   Dg Humerus Left  Result Date: 01/16/2017 CLINICAL DATA:  Pain, altered mental status does EXAM: LEFT HUMERUS - 2+ VIEW COMPARISON:  None.  FINDINGS: There is no evidence of fracture or other focal bone lesions. Soft tissues are unremarkable. IMPRESSION: Negative. Electronically Signed   By: Jasmine Pang M.D.   On: 01/16/2017 18:35     Scheduled Meds: . amoxicillin-clavulanate  1 tablet Oral Q12H  . enoxaparin (LOVENOX) injection  40 mg Subcutaneous Q24H  . folic acid  1 mg Oral  Daily  . multivitamin with minerals  1 tablet Oral Daily  . potassium chloride  40 mEq Oral BID  . thiamine  100 mg Oral Daily   Continuous Infusions: . sodium chloride 125 mL/hr at 01/16/17 1529  . sodium chloride 100 mL/hr at 01/17/17 0708  . dexmedetomidine (PRECEDEX) IV infusion 0.2 mcg/kg/hr (01/17/17 1019)     LOS: 1 day   Time Spent in minutes   35 minutes  Yaeko Fazekas D.O. on 01/17/2017 at 10:29 AM  Between 7am to 7pm - Pager - [phone]  After 7pm go to [email or web address] - password [email or web address]  And look for the night coverage person covering for me after hours  [facility] Office  [phone]

## 2017-01-17 NOTE — Progress Notes (Signed)
Pharmacy Antibiotic Note  Francisco Jacobs is a 36 y.o. male with altered mental statusadmitted on 01/16/2017 now with aspiration pna.  Pharmacy has been consulted for Unasyn dosing.  Plan: Unasyn 3 Gm IV q6h F/u scr/cultures  Weight: 225 lb 15.5 oz (102.5 kg)  Temp (24hrs), Avg:97.4 F (36.3 C), Min:97.1 F (36.2 C), Max:97.7 F (36.5 C)   Recent Labs Lab 01/16/17 1409 01/16/17 1412 01/16/17 1610 01/16/17 1711 01/17/17 0342  WBC 6.6  --   --   --  13.3*  CREATININE 1.46* 1.20  --   --  1.05  LATICACIDVEN  --  >17.00* 2.3* 1.26  --     Estimated Creatinine Clearance: 116.3 mL/min (by C-G formula based on SCr of 1.05 mg/dL).    No Known Allergies  Antimicrobials this admission: 6/17 unasyn >>    >>   Dose adjustments this admission:   Microbiology results:  BCx:   UCx:    Sputum:    MRSA PCR:   Thank you for allowing pharmacy to be a part of this patient's care.  Lorenza EvangelistGreen, Kentaro Alewine R 01/17/2017 10:29 PM

## 2017-01-18 ENCOUNTER — Encounter (HOSPITAL_COMMUNITY): Payer: Self-pay

## 2017-01-18 DIAGNOSIS — F10239 Alcohol dependence with withdrawal, unspecified: Secondary | ICD-10-CM

## 2017-01-18 LAB — GLUCOSE, CAPILLARY: Glucose-Capillary: 171 mg/dL — ABNORMAL HIGH (ref 65–99)

## 2017-01-18 LAB — CBC
HEMATOCRIT: 37 % — AB (ref 39.0–52.0)
HEMOGLOBIN: 12.4 g/dL — AB (ref 13.0–17.0)
MCH: 31.3 pg (ref 26.0–34.0)
MCHC: 33.5 g/dL (ref 30.0–36.0)
MCV: 93.4 fL (ref 78.0–100.0)
Platelets: 165 10*3/uL (ref 150–400)
RBC: 3.96 MIL/uL — ABNORMAL LOW (ref 4.22–5.81)
RDW: 12.8 % (ref 11.5–15.5)
WBC: 7.1 10*3/uL (ref 4.0–10.5)

## 2017-01-18 LAB — BASIC METABOLIC PANEL
ANION GAP: 2 — AB (ref 5–15)
BUN: 7 mg/dL (ref 6–20)
CALCIUM: 8.6 mg/dL — AB (ref 8.9–10.3)
CHLORIDE: 113 mmol/L — AB (ref 101–111)
CO2: 25 mmol/L (ref 22–32)
CREATININE: 0.96 mg/dL (ref 0.61–1.24)
GFR calc Af Amer: >60 mL/min (ref >60)
GFR calc non Af Amer: >60 mL/min (ref >60)
GLUCOSE: 97 mg/dL (ref 65–99)
Potassium: 4.1 mmol/L (ref 3.5–5.1)
Sodium: 140 mmol/L (ref 135–145)

## 2017-01-18 LAB — MRSA PCR SCREENING: MRSA by PCR: NEGATIVE

## 2017-01-18 MED ORDER — ENOXAPARIN SODIUM 40 MG/0.4ML ~~LOC~~ SOLN
40.0000 mg | SUBCUTANEOUS | Status: DC
  Administered 2017-01-18 – 2017-01-20 (×3): 40 mg via SUBCUTANEOUS
  Filled 2017-01-18 (×3): qty 0.4

## 2017-01-18 MED ORDER — CLONIDINE HCL 0.1 MG PO TABS
0.1000 mg | ORAL_TABLET | Freq: Two times a day (BID) | ORAL | Status: DC
  Administered 2017-01-18 – 2017-01-20 (×5): 0.1 mg via ORAL
  Filled 2017-01-18 (×5): qty 1

## 2017-01-18 MED ORDER — LACTATED RINGERS IV SOLN
INTRAVENOUS | Status: DC
  Administered 2017-01-18: 13:00:00 via INTRAVENOUS

## 2017-01-18 NOTE — Progress Notes (Signed)
PULMONARY / CRITICAL CARE MEDICINE   Name: Francisco Jacobs MRN: [id number] DOB: August 18, 1980    ADMISSION DATE:  01/16/2017 CONSULTATION DATE:  6/18  REFERRING MD: Catha GosselinMikhail   CHIEF COMPLAINT:  Agitated delirium/wd   HISTORY OF PRESENT ILLNESS:   36 y.o.malewith a medical history of polysubstance abuse, alcohol abuse, who presented to the emergency department with altered mental status. Patient was found by police wandering downtown earlier today with confusion. He was brought in and found to be very dehydrated. Upon my examination, patient was alert and oriented, no longer confused. Patient states he has been doing meth, Molly, cocaine, marijuana, and drinking ETOH.   SUBJECTIVE/Events:    VITAL SIGNS: BP (!) 152/96   Pulse (!) 49   Temp 98 F (36.7 C) (Oral)   Resp 18   Wt 225 lb 15.5 oz (102.5 kg)   SpO2 100%   BMI 31.52 kg/m      INTAKE / OUTPUT:  Intake/Output Summary (Last 24 hours) at 01/18/17 1040 Last data filed at 01/18/17 1000  Gross per 24 hour  Intake          8629.93 ml  Output             1245 ml  Net          7384.93 ml     General appearance: 36 year old  Male, well nourished NAD,conversant  Eyes: anicteric sclerae, moist conjunctivae; PERRL, EOMI bilaterally. Mouth:  membranes and no mucosal ulcerations;  Neck: Trachea midline; neck supple, no JVD Lungs/chest: CTA, with normal respiratory effort and no intercostal retractions CV: RRR, no MRGs  Abdomen: Soft, non-tender; no masses or HSM Extremities: No peripheral edema or extremity lymphadenopathy Skin: Normal temperature, turgor and texture; no rash, ulcers or subcutaneous nodules Neuro/ Psych: dull affect, alert and oriented to person, place and time, but still at times impulsive and requires re-direction    LABS:  BMET  Recent Labs Lab 01/16/17 1409 01/16/17 1412 01/17/17 0342 01/18/17 0307  NA 143 143 139 140  K 3.8 3.8 3.1* 4.1  CL 103 106 107 113*  CO2 9*  --  23 25  BUN 8 7 10 7    CREATININE 1.46* 1.20 1.05 0.96  GLUCOSE 176* 169* 86 97    Electrolytes  Recent Labs Lab 01/16/17 1409 01/17/17 0342 01/18/17 0307  CALCIUM 10.1 8.6* 8.6*  MG  --  2.4  --     CBC  Recent Labs Lab 01/16/17 1409 01/16/17 1412 01/17/17 0342 01/18/17 0307  WBC 6.6  --  13.3* 7.1  HGB 14.8 16.3 12.9* 12.4*  HCT 45.3 48.0 38.0* 37.0*  PLT 278  --  216 165    Coag's  Recent Labs Lab 01/16/17 2025  APTT 26  INR 1.26    Sepsis Markers  Recent Labs Lab 01/16/17 1412 01/16/17 1610 01/16/17 1711 01/16/17 2025  LATICACIDVEN >17.00* 2.3* 1.26  --   PROCALCITON  --   --   --  1.27    ABG No results for input(s): PHART, PCO2ART, PO2ART in the last 168 hours.  Liver Enzymes  Recent Labs Lab 01/16/17 1409  AST 45*  ALT 21  ALKPHOS 69  BILITOT 0.2*  ALBUMIN 4.5    Cardiac Enzymes No results for input(s): TROPONINI, PROBNP in the last 168 hours.  Glucose  Recent Labs Lab 01/16/17 1355  GLUCAP 171*    Imaging No results found.  STUDIES:    CULTURES: BCX 2 6/16>>>  ANTIBIOTICS: augmentin (r/o aspiration)  6/16-->6/18  SIGNIFICANT EVENTS:  LINES/TUBES:   DISCUSSION: 36 year old male initially admitted w/ acute encephalopathy d/t intoxication from multiple illicit drugs and ETOH. Developed confusion and agitation the following morning. Now being treated for DTs and WD. Seems to be doing better.  Will add clonidine Taper precedex Cont supportive care.   ASSESSMENT / PLAN:   Acute Encephalopathy in setting intoxication from multiple illicit drugs and alcohol.  Now more c/w delirium tremens and w/d - weaning precedex  -still impulsive but cooperative Plan Add low dose clonidine  Wean precedex to off if able Add thiamine and folate  Cont safety monitoring Will need social work consult for substance abuse  Aspiration precautions   Sinus Bradycardia  HTN -likely exacerbated by precedex Plan Cont tele Taper precedex Added  clonidine   Fluid and electrolyte imbalance  -resolved dehydration and lactic acidosis  Plan Change IVF to LR in effort to avoid hyperchloremia  F/u am labs   Anemia normocytic/normochronic Plan Trend cbc [place] [facility]   FAMILY  - Updates:   - Inter-disciplinary family meet or Palliative Care meeting due by: 6/23   Simonne Martinet ACNP-BC [facility] Pulmonary/Critical Care Pager # [phone] OR # [phone] if no answer   01/18/2017, 10:38 AM

## 2017-01-18 NOTE — Progress Notes (Signed)
PROGRESS NOTE    Francisco MediciMarcus Nesby  WGN:[id number] DOB: March 15, 1981 DOA: 01/16/2017 PCP: Lavinia SharpsPlacey, Mary Ann, NP   Chief Complaint  Patient presents with  . Altered Mental Status    Brief Narrative:  HPI on 01/16/2017  Francisco Jacobs is a 36 y.o. male with a medical history of polysubstance abuse, alcohol abuse, who presented to the emergency department with altered mental status. Patient was found by police wandering downtown earlier today with confusion. He was brought in and found to be very dehydrated. Upon my examination, patient was alert and oriented, no longer confused. Patient states he has been doing meth, Molly, cocaine, marijuana, and drinking. Patient admits to drinking 212 packs of beer per day. Currently denies any chest pain, shortness of breath, abdominal pain, nausea or vomiting, diarrhea or constipation, cough, fever, recent illness or sick contacts, recent travel.  Interim history Patient required increased amounts of Ativan and haldol. PCCM consulted for precedex drip.  Assessment & Plan   Sepsis possibly secondary to possible pneumonia vs drug use -Patient is in with fever, tachycardia, tachypnea, elevated lactic acid, all have resolved -patient now with leukocytosis, which is likely reactive to withdrawal and not actual infection as procalcitonin is 1.27 -Chest x-ray showed possible aspiration in the left lung base -Patient was given 1 dose of azithromycin and ceftriaxone emergency department. Was placed on Augmentin for possible aspiration. -Repeat chest x-ray this morning shows no infiltrate. Augmentin discontinued. Pro-calcitonin as above. -Not entirely sure why Unasyn was started, however, discontinued as well.  -Blood cultures Show no growth to date -Strep pneumonia urine antigen negative -Continue IV fluids  Acute encephalopathy, metabolic -Likely secondary to over use of illicit drugs including Molly, meth, cocaine, marijuana -Patient given IV fluid, encephalopathy  has improved. -Slightly confused this morning   Polysubstance abuse including illicit drugs and alcohol/withdrawal -Continue CIWA protocol, continue multivitamin, folic acid, thiamine (completed banana bag) -Overnight, patient became increasing agitated and required several doses of Haldol as well as Ativan -PCCM consulted and appreciated for possible Precedex drip. -Precedex drip weaned overnight, however patient became more agitated and drip increased -Discussed with PCCM, they will assume care and TRH will sign off -Counseled on need for cessation -Drug tox screen positive for cocaine and marijuana -Social work consulted  Elevated lactic acid level -Upon admission to the emergency department, lactic acid over 17. Patient given IV fluid resuscitation, lactic resolved to 1.26 -Likely secondary to drug use  Acute kidney injury -Baseline creatinine 0.7-0.8, upon admission to the ER creatinine was 1.46 -Suspect secondary to dehydration and drug use -Creatinine now 0.96 -Continue IV fluids -Continue to monitor BMP  Left arm pain -Patient states he is unable to move his arm, however has good grip strength. Feels he may have fallen on his arm prior to coming to the emergency department -Xrays obtained and unremarkable   Hypokalemia -Potassium 4.1 after replacement -Magnesium obtained, 2.4  DVT Prophylaxis  Lovenox  Code Status: Full  Family Communication: None bedside  Disposition Plan: Admitted. Will transfer care to PCCM given Precedex drip. Will sign off and reassume care when patient has improved and no longer needed precedex  Consultants PCCM  Procedures  None  Antibiotics   Anti-infectives    Start     Dose/Rate Route Frequency Ordered Stop   01/17/17 2230  Ampicillin-Sulbactam (UNASYN) 3 g in sodium chloride 0.9 % 100 mL IVPB  Status:  Discontinued     3 g 200 mL/hr over 30 Minutes Intravenous Every 6 hours 01/17/17 2216  01/18/17 0802   01/16/17 2200   amoxicillin-clavulanate (AUGMENTIN) 875-125 MG per tablet 1 tablet  Status:  Discontinued     1 tablet Oral Every 12 hours 01/16/17 1944 01/17/17 2210   01/16/17 1645  cefTRIAXone (ROCEPHIN) 1 g in dextrose 5 % 50 mL IVPB     1 g 100 mL/hr over 30 Minutes Intravenous  Once 01/16/17 1637 01/16/17 1917   01/16/17 1645  azithromycin (ZITHROMAX) 500 mg in dextrose 5 % 250 mL IVPB     500 mg 250 mL/hr over 60 Minutes Intravenous  Once 01/16/17 1637 01/16/17 2025      Subjective:   Francisco Medici seen and examined today.  Patient still confused this morning. Feels sleepy. No complaints.  Objective:   Vitals:   01/18/17 0500 01/18/17 0600 01/18/17 0700 01/18/17 0800  BP: (!) 137/94 (!) 137/95 (!) 147/96 (!) 140/103  Pulse: 61 (!) 59 (!) 58 (!) 54  Resp: 19 18 20 15   Temp:      TempSrc:      SpO2: 96% 95% 95% (!) 86%  Weight:        Intake/Output Summary (Last 24 hours) at 01/18/17 0828 Last data filed at 01/18/17 0800  Gross per 24 hour  Intake          5916.94 ml  Output             1545 ml  Net          4371.94 ml   Filed Weights   01/17/17 0500  Weight: 102.5 kg (225 lb 15.5 oz)   Exam  General: Well developed, well nourished, NAD, appears stated age  HEENT: NCAT, mucous membranes moist.   Cardiovascular: S1 S2 auscultated, no rubs, murmurs or gallops. Regular rate and rhythm.  Respiratory: Clear to auscultation bilaterally with equal chest rise  Abdomen: Soft, nontender, nondistended, + bowel sounds  Extremities: warm dry without cyanosis clubbing or edema  Neuro: AAOx3, nonfocal (mildly confused and required constant direction)  Psych: appropriate  Data Reviewed: I have personally reviewed following labs and imaging studies  CBC:  Recent Labs Lab 01/16/17 1409 01/16/17 1412 01/17/17 0342 01/18/17 0307  WBC 6.6  --  13.3* 7.1  NEUTROABS 3.6  --   --   --   HGB 14.8 16.3 12.9* 12.4*  HCT 45.3 48.0 38.0* 37.0*  MCV 98.5  --  92.7 93.4  PLT 278  --  216  165   Basic Metabolic Panel:  Recent Labs Lab 01/16/17 1409 01/16/17 1412 01/17/17 0342 01/18/17 0307  NA 143 143 139 140  K 3.8 3.8 3.1* 4.1  CL 103 106 107 113*  CO2 9*  --  23 25  GLUCOSE 176* 169* 86 97  BUN 8 7 10 7   CREATININE 1.46* 1.20 1.05 0.96  CALCIUM 10.1  --  8.6* 8.6*  MG  --   --  2.4  --    GFR: Estimated Creatinine Clearance: 127.2 mL/min (by C-G formula based on SCr of 0.96 mg/dL). Liver Function Tests:  Recent Labs Lab 01/16/17 1409  AST 45*  ALT 21  ALKPHOS 69  BILITOT 0.2*  PROT 8.8*  ALBUMIN 4.5   No results for input(s): LIPASE, AMYLASE in the last 168 hours. No results for input(s): AMMONIA in the last 168 hours. Coagulation Profile:  Recent Labs Lab 01/16/17 2025  INR 1.26   Cardiac Enzymes: No results for input(s): CKTOTAL, CKMB, CKMBINDEX, TROPONINI in the last 168 hours. BNP (last 3 results)  No results for input(s): PROBNP in the last 8760 hours. HbA1C: No results for input(s): HGBA1C in the last 72 hours. CBG: No results for input(s): GLUCAP in the last 168 hours. Lipid Profile: No results for input(s): CHOL, HDL, LDLCALC, TRIG, CHOLHDL, LDLDIRECT in the last 72 hours. Thyroid Function Tests: No results for input(s): TSH, T4TOTAL, FREET4, T3FREE, THYROIDAB in the last 72 hours. Anemia Panel: No results for input(s): VITAMINB12, FOLATE, FERRITIN, TIBC, IRON, RETICCTPCT in the last 72 hours. Urine analysis:    Component Value Date/Time   COLORURINE YELLOW 01/16/2017 1735   APPEARANCEUR HAZY (A) 01/16/2017 1735   LABSPEC 1.025 01/16/2017 1735   PHURINE 6.5 01/16/2017 1735   GLUCOSEU NEGATIVE 01/16/2017 1735   HGBUR LARGE (A) 01/16/2017 1735   BILIRUBINUR NEGATIVE 01/16/2017 1735   KETONESUR NEGATIVE 01/16/2017 1735   PROTEINUR 30 (A) 01/16/2017 1735   UROBILINOGEN 0.2 05/11/2014 2133   NITRITE NEGATIVE 01/16/2017 1735   LEUKOCYTESUR SMALL (A) 01/16/2017 1735   Sepsis  Labs: @LABRCNTIP (procalcitonin:4,lacticidven:4)  ) Recent Results (from the past 240 hour(s))  Blood culture (routine x 2)     Status: None (Preliminary result)   Collection Time: 01/16/17  5:35 PM  Result Value Ref Range Status   Specimen Description BLOOD RIGHT ANTECUBITAL  Final   Special Requests IN PEDIATRIC BOTTLE Blood Culture adequate volume  Final   Culture   Final    NO GROWTH < 12 HOURS Performed at [facility], [address], [place], [place] [address]    Report Status PENDING  Incomplete  Blood culture (routine x 2)     Status: None (Preliminary result)   Collection Time: 01/16/17  8:25 PM  Result Value Ref Range Status   Specimen Description BLOOD LEFT ANTECUBITAL  Final   Special Requests   Final    BOTTLES DRAWN AEROBIC AND ANAEROBIC Blood Culture adequate volume   Culture   Final    NO GROWTH < 12 HOURS Performed at [facility], [address], [place], [place] [address]    Report Status PENDING  Incomplete  MRSA PCR Screening     Status: None   Collection Time: 01/17/17  8:30 PM  Result Value Ref Range Status   MRSA by PCR NEGATIVE NEGATIVE Final    Comment:        The GeneXpert MRSA Assay (FDA approved for NASAL specimens only), is one component of a comprehensive MRSA colonization surveillance program. It is not intended to diagnose MRSA infection nor to guide or monitor treatment for MRSA infections.       Radiology Studies: Dg Elbow 2 Views Left  Result Date: 01/16/2017 CLINICAL DATA:  Pain, altered mental status EXAM: LEFT ELBOW - 2 VIEW COMPARISON:  None. FINDINGS: There is no evidence of fracture, dislocation, or joint effusion. There is no evidence of arthropathy or other focal bone abnormality. Soft tissues are unremarkable. IMPRESSION: Negative. Electronically Signed   By: Jasmine Pang M.D.   On: 01/16/2017 18:35   Dg Forearm Left  Result Date: 01/16/2017 CLINICAL DATA:  Pain, altered mental status EXAM: LEFT FOREARM - 2  VIEW COMPARISON:  None. FINDINGS: There is no evidence of fracture or other focal bone lesions. Soft tissues are unremarkable. IMPRESSION: No acute osseous abnormality. Electronically Signed   By: Jasmine Pang M.D.   On: 01/16/2017 18:36   Dg Chest Port 1 View  Result Date: 01/17/2017 CLINICAL DATA:  Seen and admitted from ED 1 day ago - patient with fever  and tachycardia, with possible pneumonia on x-ray - follow up PNA - current smoker - hx asthma EXAM: PORTABLE CHEST 1 VIEW COMPARISON:  01/16/2017 FINDINGS: The heart size and mediastinal contours are within normal limits. Both lungs are clear. The visualized skeletal structures are unremarkable. Shallow lung inflation. IMPRESSION: No active disease. Electronically Signed   By: Norva Pavlov M.D.   On: 01/17/2017 13:37   Dg Chest Portable 1 View  Result Date: 01/16/2017 CLINICAL DATA:  Shortness of breath and altered mental status. Alcohol abuse. EXAM: PORTABLE CHEST 1 VIEW COMPARISON:  None. FINDINGS: Limited low volume chest. Subtle asymmetric density at the left base, following the diaphragm. No edema, effusion, or pneumothorax. Normal heart size and mediastinal contours for technique. IMPRESSION: Limited low volume chest. Atelectasis, aspiration or pneumonia at the left base. Electronically Signed   By: Marnee Spring M.D.   On: 01/16/2017 16:10   Dg Humerus Left  Result Date: 01/16/2017 CLINICAL DATA:  Pain, altered mental status does EXAM: LEFT HUMERUS - 2+ VIEW COMPARISON:  None. FINDINGS: There is no evidence of fracture or other focal bone lesions. Soft tissues are unremarkable. IMPRESSION: Negative. Electronically Signed   By: Jasmine Pang M.D.   On: 01/16/2017 18:35     Scheduled Meds: . folic acid  1 mg Oral Daily  . mouth rinse  15 mL Mouth Rinse BID  . multivitamin with minerals  1 tablet Oral Daily  . potassium chloride  40 mEq Oral BID  . thiamine  100 mg Oral Daily   Continuous Infusions: . sodium chloride 125 mL/hr  at 01/17/17 1600  . sodium chloride 100 mL/hr at 01/17/17 1606  . dexmedetomidine (PRECEDEX) IV infusion 0.6 mcg/kg/hr (01/18/17 0814)     LOS: 2 days   Time Spent in minutes   35 minutes  Breeze Angell D.O. on 01/18/2017 at 8:28 AM  Between 7am to 7pm - Pager - [phone]  After 7pm go to [email or web address] - password [email or web address]  And look for the night coverage person covering for me after hours  [facility] Office  [phone]

## 2017-01-18 NOTE — Progress Notes (Addendum)
LCSW consulted for drug abuse.  Chart reviewed with documentation stating:  He is homeless and [facility] and they are trying to help him get a job and go to school but he states it's difficult due to his drug use.   [place] the streets of [place].  Brought in by [employer]. [facility] is [facility] with [facility] and also [facility] program per review.  LCSW called Lattie Hawharlotte Evans, RN at [facility] to discuss case as patient is active with [facility].  Reports back in May patient was doing well, but she has yet to see him other than chart and noting he was in the ED.  Claris GowerCharlotte is unsure if patient is currently homeless or in [facility] as she was assisting him back in April.  RN will Regulatory affairs officercall writer back and update once she speaks with [facility].  If patient is homeless, LCSW can make referral to Path Team through [facility] for homelessness. Per Claris Gowerharlotte patient was [facility] until June 5th, however he returned to the streets and not back at [facility], thus his bed was given away. [facility] full, but if patient is wanting to return to shelter, on day of discharge, LCSW can call and see if they have availability due to patient having no behavioral problems or unable to return.  Patient is considered homeless at this point and can be referred to [facility].    Will also discuss options for soberity/treatment once patient is medically detoxed and able to participate in assessment of needs and options.  Due to patient's lack of insurance only options would be [facility] and [facility].  But we can explore with him what he would like to do.  Will follow up with plans.    Plan: TBD.  [facility] and [facility] and [facility] for medical appointments as he is currently active.   Deretha EmoryHannah Gladyse Corvin LCSW, MSW Clinical Social Work: Optician, dispensingystem [employer] Coverage for :  [phone]

## 2017-01-18 NOTE — Care Management Note (Signed)
Case Management Note  Patient Details  Name: Francisco Jacobs MRN: [id number] Date of Birth: 01-07-1981  Subjective/Objective:                  36 y.o.malewith a medical history of polysubstance abuse, alcohol abuse, who presented to the emergency department with altered mental status. Patient was found by police wandering downtown earlier today with confusion. He was brought in and found to be very dehydrated. Upon my examination, patient was alert and oriented, no longer confused. Patient states he has been doing meth, Molly, cocaine, marijuana, and drinking. Patient admits to drinking 212 packs of beer per day. Currently denies any chest pain, shortness of breath, abdominal pain, nausea or vomiting, diarrhea or constipation, cough, fever, recent illness or sick contacts, recent travel.  Action/Plan: Date:  January 18, 2017  Chart reviewed for concurrent status and case management needs.  Will continue to follow patient progress.  Discharge Planning: following for needs  Expected discharge date: [id number]  Marcelle SmilingRhonda Davis, BSN, [place]RN3, [place]CCM   [phone]   Expected Discharge Date:                  Expected Discharge Plan:  Homeless Shelter  In-House Referral:  Clinical Social Work  Discharge planning Services  CM Consult  Post Acute Care Choice:    Choice offered to:     DME Arranged:    DME Agency:     HH Arranged:    HH Agency:     Status of Service:  In process, will continue to follow  If discussed at Long Length of Stay Meetings, dates discussed:    Additional Comments:  Golda AcreDavis, Rhonda Lynn, RN 01/18/2017, 9:20 AM

## 2017-01-19 NOTE — Progress Notes (Signed)
PULMONARY / CRITICAL CARE MEDICINE   Name: Francisco Jacobs MRN: [id number] DOB: 03/01/1981    ADMISSION DATE:  01/16/2017 CONSULTATION DATE:  6/18  REFERRING MD: Catha Gosselin   CHIEF COMPLAINT:  Agitated delirium/wd   HISTORY OF PRESENT ILLNESS:   36 y.o.malewith a medical history of polysubstance abuse, alcohol abuse, who presented to the emergency department with altered mental status. Patient was found by police wandering downtown earlier today with confusion. He was brought in and found to be very dehydrated. Upon my examination, patient was alert and oriented, no longer confused. Patient states he has been doing meth, Molly, cocaine, marijuana, and drinking ETOH.   SUBJECTIVE/Events:  Feels back to "normal"  VITAL SIGNS: BP 132/85   Pulse 68   Temp 98.3 F (36.8 C) (Oral)   Resp 13   Ht 6' (1.829 m)   Wt 222 lb 14.2 oz (101.1 kg)   SpO2 100%   BMI 30.23 kg/m      INTAKE / OUTPUT:  Intake/Output Summary (Last 24 hours) at 01/19/17 1108 Last data filed at 01/19/17 0700  Gross per 24 hour  Intake          1568.86 ml  Output             1450 ml  Net           118.86 ml   General appearance:  36 Year old  Male, well nourished, NAD, conversant  Eyes: anicteric sclerae, moist conjunctivae; PERRL, EOMI bilaterally. Mouth:  membranes and no mucosal ulcerations; normal hard and soft palate Neck: Trachea midline; neck supple, no JVD Lungs/chest: CTA, with normal respiratory effort and no intercostal retractions CV: RRR, no MRGs  Abdomen: Soft, non-tender; no masses or HSM Extremities: No peripheral edema or extremity lymphadenopathy Skin: Normal temperature, turgor and texture; no rash, ulcers or subcutaneous nodules Neuro/ Psych: Appropriate affect, alert and oriented to person, place and time    LABS:  BMET  Recent Labs Lab 01/16/17 1409 01/16/17 1412 01/17/17 0342 01/18/17 0307  NA 143 143 139 140  K 3.8 3.8 3.1* 4.1  CL 103 106 107 113*  CO2 9*  --  23 25   BUN 8 7 10 7   CREATININE 1.46* 1.20 1.05 0.96  GLUCOSE 176* 169* 86 97    Electrolytes  Recent Labs Lab 01/16/17 1409 01/17/17 0342 01/18/17 0307  CALCIUM 10.1 8.6* 8.6*  MG  --  2.4  --     CBC  Recent Labs Lab 01/16/17 1409 01/16/17 1412 01/17/17 0342 01/18/17 0307  WBC 6.6  --  13.3* 7.1  HGB 14.8 16.3 12.9* 12.4*  HCT 45.3 48.0 38.0* 37.0*  PLT 278  --  216 165    Coag's  Recent Labs Lab 01/16/17 2025  APTT 26  INR 1.26    Sepsis Markers  Recent Labs Lab 01/16/17 1412 01/16/17 1610 01/16/17 1711 01/16/17 2025  LATICACIDVEN >17.00* 2.3* 1.26  --   PROCALCITON  --   --   --  1.27    ABG No results for input(s): PHART, PCO2ART, PO2ART in the last 168 hours.  Liver Enzymes  Recent Labs Lab 01/16/17 1409  AST 45*  ALT 21  ALKPHOS 69  BILITOT 0.2*  ALBUMIN 4.5    Cardiac Enzymes No results for input(s): TROPONINI, PROBNP in the last 168 hours.  Glucose  Recent Labs Lab 01/16/17 1355  GLUCAP 171*    Imaging No results found.  STUDIES:    CULTURES: BCX 2 6/16>>>  ANTIBIOTICS: augmentin (r/o aspiration) 6/16-->6/18  SIGNIFICANT EVENTS:  LINES/TUBES:   DISCUSSION: 36 year old male initially admitted w/ acute encephalopathy d/t intoxication from multiple illicit drugs and ETOH. Developed confusion and agitation the following morning. Treated for DTs and WD on 6/18. Now better.  [facility]   ASSESSMENT / PLAN:   Acute Encephalopathy in setting intoxication from multiple illicit drugs and alcohol.  c/b delirium tremens and w/d-->seems to have resolved.  Plan Taper clonidine  Cont thiamine and folate  Social work following for dispo as he is homeless.  CIWA protocol  Sinus Bradycardia -->resolved  HTN -likely exacerbated by precedex Plan Dc tele    Fluid and electrolyte imbalance  -resolved dehydration and lactic acidosis  Plan Dc ivf nsl   Anemia  normocytic/normochronic Plan Trend cbc Cont lmwh   FAMILY  - Updates:   - Inter-disciplinary family meet or Palliative Care meeting due by: 6/23   Simonne MartinetPeter E Peggy Monk ACNP-BC [facility] Pager # [phone] OR # [phone] if no answer   01/19/2017, 11:08 AM

## 2017-01-20 MED ORDER — FOLIC ACID 1 MG PO TABS: 1.0000 mg | ORAL_TABLET | Freq: Every day | ORAL | 0 refills | Status: AC

## 2017-01-20 MED ORDER — THIAMINE HCL 100 MG PO TABS: 100.0000 mg | ORAL_TABLET | Freq: Every day | ORAL | 0 refills | Status: AC

## 2017-01-20 MED ORDER — ACETAMINOPHEN 325 MG PO TABS: 650.0000 mg | ORAL_TABLET | Freq: Four times a day (QID) | ORAL | Status: DC | PRN

## 2017-01-20 NOTE — Discharge Summary (Addendum)
Physician Discharge Summary  Roth Ress ZOX:[id number] DOB: [birth year] DOA: 01/16/2017  PCP: Lavinia Sharps, NP  Admit date: 01/16/2017 Discharge date: 01/20/2017  Admitted From: Home/homeless Disposition:  Same  Recommendations for Outpatient Follow-up:  1. Follow up with [facility] [place] [facility] and [facility] for medical appointments 2. Follow up final blood culture results 3. Follow up for repeat BMP, CBC in one week  Home Health: No  Equipment/Devices: None   Discharge Condition: Stable CODE STATUS: Full  Diet recommendation: Regular   Brief/Interim Summary: From H&P: Ugo Thoma is a 36 year old male with medical history of polysubstance abuse, alcohol abuse who presented to the emergency department with altered mental status. Patient was found by police wandering downtown earlier with confusion. Patient was brought in and found to be very dehydrated. Patient had admitted to doing meth, Molly, cocaine, marijuana, alcohol. He admitted to drinking 2 x 12 packs of beer per day. Patient was admitted secondary to acute metabolic encephalopathy, sepsis, possibly secondary to aspiration pneumonia versus drug abuse, polysubstance abuse with alcohol withdrawal, acute kidney injury. Critical care service was consulted on 6/17, has patient was found to be hallucinating and needed Precedex initiation. Eventually, patient was weaned off Precedex, was alert, oriented. Social work has been involved due to social needs.  Discharge Diagnoses:  Active Problems:   Sepsis (HCC)   Dehydration   Substance-induced delirium (HCC)   Fever   Community acquired pneumonia of left lower lobe of lung (HCC)   Pain  SIRS secondary to drug and alcohol withdrawal   -Patient is in with fever, tachycardia, tachypnea, elevated lactic acid, all have resolved -Patient now with leukocytosis, which is likely reactive to withdrawal and not actual infection  -Chest x-ray showed possible aspiration in the left lung  base -Patient was given 1 dose of azithromycin and ceftriaxone emergency department. Was placed on Augmentin for possible aspiration. Repeat chest x-ray showed no infiltrate. Augmentin discontinued.  -Blood cultures show no growth to date -Strep pneumonia urine antigen negative -Patient has been afebrile, on room air, normal WBC off antibiotics. No source of infectious etiology found. SIRS pathology may have been secondary to drug withdrawal.   Acute encephalopathy, metabolic -Likely secondary to over use of illicit drugs including Molly, meth, cocaine, marijuana -Resolved   Polysubstance abuse including illicit drugs and alcohol/withdrawal -CIWA protocol, continue multivitamin, folic acid, thiamine (completed banana bag) -PCCM consulted and appreciated for Precedex drip. Weaned off.   Elevated lactic acid level -Upon admission to the emergency department, lactic acid over 17. Patient given IV fluid resuscitation, lactic resolved to 1.26 -Likely secondary to drug use  Acute kidney injury -Baseline creatinine 0.7-0.8, upon admission to the ER creatinine was 1.46 -Suspect secondary to dehydration and drug use -Resolved with IVF   Left arm pain -Patient states he is unable to move his arm, however has good grip strength. Feels he may have fallen on his arm prior to coming to the emergency department -Xrays obtained and unremarkable  -Supportive care   Hypokalemia -Potassium 4.1 after replacement -Magnesium obtained, 2.4 -Resolved with replacement   Discharge Instructions  Discharge Instructions    Call MD for:  difficulty breathing, headache or visual disturbances    Complete by:  As directed    Call MD for:  extreme fatigue    Complete by:  As directed    Call MD for:  hives    Complete by:  As directed    Call MD for:  persistant dizziness or light-headedness  Complete by:  As directed    Call MD for:  persistant nausea and vomiting    Complete by:  As directed     Call MD for:  severe uncontrolled pain    Complete by:  As directed    Call MD for:  temperature >100.4    Complete by:  As directed    Diet - low sodium heart healthy    Complete by:  As directed    Discharge instructions    Complete by:  As directed    You were cared for by a hospitalist during your hospital stay. If you have any questions about your discharge medications or the care you received while you were in the hospital after you are discharged, you can call the unit and asked to speak with the hospitalist on call if the hospitalist that took care of you is not available. Once you are discharged, your primary care physician will handle any further medical issues. Please note that NO REFILLS for any discharge medications will be authorized once you are discharged, as it is imperative that you return to your primary care physician (or establish a relationship with a primary care physician if you do not have one) for your aftercare needs so that they can reassess your need for medications and monitor your lab values.   Increase activity slowly    Complete by:  As directed      Allergies as of 01/20/2017   No Known Allergies     Medication List    TAKE these medications   folic acid 1 MG tablet Commonly known as:  FOLVITE Take 1 tablet (1 mg total) by mouth daily. Start taking on:  01/21/2017   thiamine 100 MG tablet Take 1 tablet (100 mg total) by mouth daily. Start taking on:  01/21/2017      Follow-up Information    Placey, Chales Abrahams, NP. Schedule an appointment as soon as possible for a visit in 1 week(s).   Contact information: [address] [place] [address] [phone]          No Known Allergies  Consultations:  PCCM   Procedures/Studies: Dg Elbow 2 Views Left  Result Date: 01/16/2017 CLINICAL DATA:  Pain, altered mental status EXAM: LEFT ELBOW - 2 VIEW COMPARISON:  None. FINDINGS: There is no evidence of fracture, dislocation, or joint effusion.  There is no evidence of arthropathy or other focal bone abnormality. Soft tissues are unremarkable. IMPRESSION: Negative. Electronically Signed   By: Jasmine Pang M.D.   On: 01/16/2017 18:35   Dg Forearm Left  Result Date: 01/16/2017 CLINICAL DATA:  Pain, altered mental status EXAM: LEFT FOREARM - 2 VIEW COMPARISON:  None. FINDINGS: There is no evidence of fracture or other focal bone lesions. Soft tissues are unremarkable. IMPRESSION: No acute osseous abnormality. Electronically Signed   By: Jasmine Pang M.D.   On: 01/16/2017 18:36   Dg Chest Port 1 View  Result Date: 01/17/2017 CLINICAL DATA:  Seen and admitted from ED 1 day ago - patient with fever and tachycardia, with possible pneumonia on x-ray - follow up PNA - current smoker - hx asthma EXAM: PORTABLE CHEST 1 VIEW COMPARISON:  01/16/2017 FINDINGS: The heart size and mediastinal contours are within normal limits. Both lungs are clear. The visualized skeletal structures are unremarkable. Shallow lung inflation. IMPRESSION: No active disease. Electronically Signed   By: Norva Pavlov M.D.   On: 01/17/2017 13:37   Dg Chest Portable 1 View  Result Date: 01/16/2017 CLINICAL DATA:  Shortness of breath and altered mental status. Alcohol abuse. EXAM: PORTABLE CHEST 1 VIEW COMPARISON:  None. FINDINGS: Limited low volume chest. Subtle asymmetric density at the left base, following the diaphragm. No edema, effusion, or pneumothorax. Normal heart size and mediastinal contours for technique. IMPRESSION: Limited low volume chest. Atelectasis, aspiration or pneumonia at the left base. Electronically Signed   By: Marnee Spring M.D.   On: 01/16/2017 16:10   Dg Humerus Left  Result Date: 01/16/2017 CLINICAL DATA:  Pain, altered mental status does EXAM: LEFT HUMERUS - 2+ VIEW COMPARISON:  None. FINDINGS: There is no evidence of fracture or other focal bone lesions. Soft tissues are unremarkable. IMPRESSION: Negative. Electronically Signed   By: Jasmine Pang M.D.   On: 01/16/2017 18:35       Discharge Exam: Vitals:   01/20/17 0606 01/20/17 0931  BP: (!) 108/92 133/62  Pulse: 74 77  Resp: 18 18  Temp: 98.2 F (36.8 C) 98.3 F (36.8 C)   Vitals:   01/19/17 1420 01/19/17 2027 01/20/17 0606 01/20/17 0931  BP: 136/87 131/80 (!) 108/92 133/62  Pulse: 70 94 74 77  Resp: 18 18 18 18   Temp: 97.8 F (36.6 C) 98.5 F (36.9 C) 98.2 F (36.8 C) 98.3 F (36.8 C)  TempSrc: Oral Oral Oral Oral  SpO2: 100% 100% 99% 100%  Weight: 87.4 kg (192 lb 10.9 oz)     Height: 6' (1.829 m)       General: Pt is alert, awake, not in acute distress Cardiovascular: RRR, S1/S2 +, no rubs, no gallops Respiratory: CTA bilaterally, no wheezing, no rhonchi Abdominal: Soft, NT, ND, bowel sounds + Extremities: no edema, no cyanosis    The results of significant diagnostics from this hospitalization (including imaging, microbiology, ancillary and laboratory) are listed below for reference.     Microbiology: Recent Results (from the past 240 hour(s))  Blood culture (routine x 2)     Status: None (Preliminary result)   Collection Time: 01/16/17  5:35 PM  Result Value Ref Range Status   Specimen Description BLOOD RIGHT ANTECUBITAL  Final   Special Requests IN PEDIATRIC BOTTLE Blood Culture adequate volume  Final   Culture   Final    NO GROWTH 3 DAYS Performed at [facility], [address] [place], [place] [address]    Report Status PENDING  Incomplete  Blood culture (routine x 2)     Status: None (Preliminary result)   Collection Time: 01/16/17  8:25 PM  Result Value Ref Range Status   Specimen Description BLOOD LEFT ANTECUBITAL  Final   Special Requests   Final    BOTTLES DRAWN AEROBIC AND ANAEROBIC Blood Culture adequate volume   Culture   Final    NO GROWTH 3 DAYS Performed at [facility], [address], [place], [place] [address]    Report Status PENDING  Incomplete  MRSA PCR Screening     Status: None   Collection  Time: 01/17/17  8:30 PM  Result Value Ref Range Status   MRSA by PCR NEGATIVE NEGATIVE Final    Comment:        The GeneXpert MRSA Assay (FDA approved for NASAL specimens only), is one component of a comprehensive MRSA colonization surveillance program. It is not intended to diagnose MRSA infection nor to guide or monitor treatment for MRSA infections.      Labs: BNP (last 3 results) No results for input(s): BNP in  the last 8760 hours. Basic Metabolic Panel:  Recent Labs Lab 01/16/17 1409 01/16/17 1412 01/17/17 0342 01/18/17 0307  NA 143 143 139 140  K 3.8 3.8 3.1* 4.1  CL 103 106 107 113*  CO2 9*  --  23 25  GLUCOSE 176* 169* 86 97  BUN 8 7 10 7   CREATININE 1.46* 1.20 1.05 0.96  CALCIUM 10.1  --  8.6* 8.6*  MG  --   --  2.4  --    Liver Function Tests:  Recent Labs Lab 01/16/17 1409  AST 45*  ALT 21  ALKPHOS 69  BILITOT 0.2*  PROT 8.8*  ALBUMIN 4.5   No results for input(s): LIPASE, AMYLASE in the last 168 hours. No results for input(s): AMMONIA in the last 168 hours. CBC:  Recent Labs Lab 01/16/17 1409 01/16/17 1412 01/17/17 0342 01/18/17 0307  WBC 6.6  --  13.3* 7.1  NEUTROABS 3.6  --   --   --   HGB 14.8 16.3 12.9* 12.4*  HCT 45.3 48.0 38.0* 37.0*  MCV 98.5  --  92.7 93.4  PLT 278  --  216 165   Cardiac Enzymes: No results for input(s): CKTOTAL, CKMB, CKMBINDEX, TROPONINI in the last 168 hours. BNP: Invalid input(s): POCBNP CBG:  Recent Labs Lab 01/16/17 1355  GLUCAP 171*   D-Dimer No results for input(s): DDIMER in the last 72 hours. Hgb A1c No results for input(s): HGBA1C in the last 72 hours. Lipid Profile No results for input(s): CHOL, HDL, LDLCALC, TRIG, CHOLHDL, LDLDIRECT in the last 72 hours. Thyroid function studies No results for input(s): TSH, T4TOTAL, T3FREE, THYROIDAB in the last 72 hours.  Invalid input(s): FREET3 Anemia work up No results for input(s): VITAMINB12, FOLATE, FERRITIN, TIBC, IRON, RETICCTPCT in the  last 72 hours. Urinalysis    Component Value Date/Time   COLORURINE YELLOW 01/16/2017 1735   APPEARANCEUR HAZY (A) 01/16/2017 1735   LABSPEC 1.025 01/16/2017 1735   PHURINE 6.5 01/16/2017 1735   GLUCOSEU NEGATIVE 01/16/2017 1735   HGBUR LARGE (A) 01/16/2017 1735   BILIRUBINUR NEGATIVE 01/16/2017 1735   KETONESUR NEGATIVE 01/16/2017 1735   PROTEINUR 30 (A) 01/16/2017 1735   UROBILINOGEN 0.2 05/11/2014 2133   NITRITE NEGATIVE 01/16/2017 1735   LEUKOCYTESUR SMALL (A) 01/16/2017 1735   Sepsis Labs Invalid input(s): PROCALCITONIN,  WBC,  LACTICIDVEN Microbiology Recent Results (from the past 240 hour(s))  Blood culture (routine x 2)     Status: None (Preliminary result)   Collection Time: 01/16/17  5:35 PM  Result Value Ref Range Status   Specimen Description BLOOD RIGHT ANTECUBITAL  Final   Special Requests IN PEDIATRIC BOTTLE Blood Culture adequate volume  Final   Culture   Final    NO GROWTH 3 DAYS Performed at [facility], [address] [place], [place] [address]    Report Status PENDING  Incomplete  Blood culture (routine x 2)     Status: None (Preliminary result)   Collection Time: 01/16/17  8:25 PM  Result Value Ref Range Status   Specimen Description BLOOD LEFT ANTECUBITAL  Final   Special Requests   Final    BOTTLES DRAWN AEROBIC AND ANAEROBIC Blood Culture adequate volume   Culture   Final    NO GROWTH 3 DAYS Performed at [facility], [address] [place], [place] [address]    Report Status PENDING  Incomplete  MRSA PCR Screening     Status: None   Collection Time: 01/17/17  8:30 PM  Result Value Ref Range Status   MRSA by PCR NEGATIVE NEGATIVE Final    Comment:        The GeneXpert MRSA Assay (FDA approved for NASAL specimens only), is one component of a comprehensive MRSA colonization surveillance program. It is not intended to diagnose MRSA infection nor to guide or monitor treatment for MRSA infections.      Time coordinating  discharge: 40 minutes  SIGNED:  Noralee Stain, DO [facility] Pager [phone]  If 7PM-7AM, please contact night-coverage [email or web address] Password [email or web address] 01/20/2017, 11:14 AM

## 2017-01-20 NOTE — Progress Notes (Signed)
Patient given discharge instructions, and verbalized an understanding of all discharge instructions.  Patient agrees with discharge plan, and is being discharged in stable medical condition.  

## 2017-01-20 NOTE — Progress Notes (Signed)
CSW met with patient at bedside, explain role and reason for visit. Patient agreeable to talk. CSW inquired about patient homelessness. Patient reports he was staying at the [facility] and lost housing June 5. He reports he spent eleven days in jail and then came to thehospital and the facility did not hold his bed.  CSW inquired about bed availability, [facility] in [place] has beds available at this time. CSW instructed patient to go for intake process. Patient reports, "where there is a will I will get there." Patient reports he has been working with Leamon Arnt at the [facility] and Don Perking with [employer] for behavioral healthcare at [facility].  CSW confirmed with staff at [facility], Baldo Ash will be able to see patient today once discharged.  CSW also discussed residential treatment options [facility] and [facility] for patient substance use. Patient reports he is familiar with them, but not ready to commit.  Patient reports he plans to follow up at [facility] today. CSW provided patient bus passes to transport to [facility] and a bus [place] to shelter.  Patient reports no other needs at this time.   Kathrin Greathouse, Latanya Presser, MSW Clinical Social Worker 5E and Psychiatric Service Line [phone] 01/20/2017  11:48 AM

## 2017-01-21 LAB — CULTURE, BLOOD (ROUTINE X 2)
CULTURE: NO GROWTH
Culture: NO GROWTH
SPECIAL REQUESTS: ADEQUATE
Special Requests: ADEQUATE

## 2017-01-25 ENCOUNTER — Encounter (HOSPITAL_COMMUNITY): Payer: Self-pay | Admitting: *Deleted

## 2017-01-25 ENCOUNTER — Emergency Department (HOSPITAL_COMMUNITY)
Admission: EM | Admit: 2017-01-25 | Discharge: 2017-01-25 | Disposition: A | Discharge status: Home / Self Care | Payer: Self-pay | Attending: Emergency Medicine | Admitting: Emergency Medicine

## 2017-01-25 DIAGNOSIS — Z5321 Procedure and treatment not carried out due to patient leaving prior to being seen by health care provider: Secondary | ICD-10-CM | POA: Insufficient documentation

## 2017-01-25 DIAGNOSIS — F419 Anxiety disorder, unspecified: Secondary | ICD-10-CM | POA: Insufficient documentation
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
## 2017-01-25 NOTE — ED Notes (Signed)
Pt called for room 2nd time, no response.  

## 2017-01-25 NOTE — ED Triage Notes (Signed)
Pt bib EMS and presents after using Molly 4 hours ago.  Pt reports anxiety. EMS denied pain or any other symptoms to EMS.

## 2017-01-25 NOTE — ED Notes (Signed)
Writer states they called for patient in lobby to be taken to hall C and patient did not respond.
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
## 2017-04-10 ENCOUNTER — Emergency Department (HOSPITAL_COMMUNITY)
Admission: EM | Admit: 2017-04-10 | Discharge: 2017-05-03 | Disposition: E | Payer: Self-pay | Attending: Emergency Medicine | Admitting: Emergency Medicine

## 2017-04-10 DIAGNOSIS — F1410 Cocaine abuse, uncomplicated: Secondary | ICD-10-CM | POA: Insufficient documentation

## 2017-04-10 DIAGNOSIS — F17200 Nicotine dependence, unspecified, uncomplicated: Secondary | ICD-10-CM | POA: Insufficient documentation

## 2017-04-10 DIAGNOSIS — F319 Bipolar disorder, unspecified: Secondary | ICD-10-CM | POA: Insufficient documentation

## 2017-04-10 DIAGNOSIS — F200 Paranoid schizophrenia: Secondary | ICD-10-CM | POA: Insufficient documentation

## 2017-04-10 DIAGNOSIS — I469 Cardiac arrest, cause unspecified: Secondary | ICD-10-CM | POA: Insufficient documentation

## 2017-04-10 DIAGNOSIS — E872 Acidosis, unspecified: Secondary | ICD-10-CM

## 2017-04-10 DIAGNOSIS — F419 Anxiety disorder, unspecified: Secondary | ICD-10-CM | POA: Insufficient documentation

## 2017-04-10 DIAGNOSIS — I444 Left anterior fascicular block: Secondary | ICD-10-CM | POA: Insufficient documentation

## 2017-04-10 DIAGNOSIS — I4510 Unspecified right bundle-branch block: Secondary | ICD-10-CM | POA: Insufficient documentation

## 2017-04-10 DIAGNOSIS — Z79899 Other long term (current) drug therapy: Secondary | ICD-10-CM | POA: Insufficient documentation

## 2017-04-10 LAB — I-STAT CHEM 8, ED
BUN: 8 mg/dL (ref 6–20)
CHLORIDE: 114 mmol/L — AB (ref 101–111)
Calcium, Ion: 1.06 mmol/L — ABNORMAL LOW (ref 1.15–1.40)
Creatinine, Ser: 1.2 mg/dL (ref 0.61–1.24)
GLUCOSE: 139 mg/dL — AB (ref 65–99)
HEMATOCRIT: 49 % (ref 39.0–52.0)
Hemoglobin: 16.7 g/dL (ref 13.0–17.0)
POTASSIUM: 4.9 mmol/L (ref 3.5–5.1)
SODIUM: 147 mmol/L — AB (ref 135–145)
TCO2: 11 mmol/L — ABNORMAL LOW (ref 22–32)

## 2017-04-10 LAB — CBC WITH DIFFERENTIAL/PLATELET
BASOS PCT: 1 %
Basophils Absolute: 0.1 10*3/uL (ref 0.0–0.1)
EOS PCT: 1 %
Eosinophils Absolute: 0.1 10*3/uL (ref 0.0–0.7)
HEMATOCRIT: 47.7 % (ref 39.0–52.0)
Hemoglobin: 14.3 g/dL (ref 13.0–17.0)
Lymphocytes Relative: 57 %
Lymphs Abs: 3.4 10*3/uL (ref 0.7–4.0)
MCH: 30.6 pg (ref 26.0–34.0)
MCHC: 30 g/dL (ref 30.0–36.0)
MCV: 101.9 fL — AB (ref 78.0–100.0)
MONO ABS: 0.4 10*3/uL (ref 0.1–1.0)
Monocytes Relative: 6 %
NEUTROS PCT: 35 %
Neutro Abs: 2.1 10*3/uL (ref 1.7–7.7)
Platelets: 170 10*3/uL (ref 150–400)
RBC: 4.68 MIL/uL (ref 4.22–5.81)
RDW: 14.3 % (ref 11.5–15.5)
WBC: 6.1 10*3/uL (ref 4.0–10.5)

## 2017-04-10 LAB — APTT: aPTT: 32 seconds (ref 24–36)

## 2017-04-10 LAB — I-STAT CG4 LACTIC ACID, ED

## 2017-04-10 LAB — I-STAT TROPONIN, ED: Troponin i, poc: 0.03 ng/mL (ref 0.00–0.08)

## 2017-04-10 LAB — ETHANOL: ALCOHOL ETHYL (B): 57 mg/dL — AB (ref <5)

## 2017-04-10 LAB — PHOSPHORUS: Phosphorus: 9.5 mg/dL — ABNORMAL HIGH (ref 2.5–4.6)

## 2017-04-10 LAB — MAGNESIUM: MAGNESIUM: 2.4 mg/dL (ref 1.7–2.4)

## 2017-04-10 MED ORDER — EPINEPHRINE PF 1 MG/10ML IJ SOSY
PREFILLED_SYRINGE | INTRAMUSCULAR | Status: DC | PRN
  Administered 2017-04-10 (×5): 1 mg via INTRAVENOUS

## 2017-04-10 MED ORDER — SODIUM BICARBONATE 8.4 % IV SOLN
INTRAVENOUS | Status: DC | PRN
  Administered 2017-04-10: 50 meq via INTRAVENOUS

## 2017-04-10 MED ORDER — ETOMIDATE 2 MG/ML IV SOLN
INTRAVENOUS | Status: DC | PRN
  Administered 2017-04-10: 10 mg via INTRAVENOUS

## 2017-04-10 MED ORDER — EPINEPHRINE PF 1 MG/10ML IJ SOSY
PREFILLED_SYRINGE | INTRAMUSCULAR | Status: DC | PRN
  Administered 2017-04-10: 1 mg via INTRAVENOUS

## 2017-04-10 MED ORDER — SODIUM BICARBONATE 8.4 % IV SOLN
INTRAVENOUS | Status: DC | PRN
  Administered 2017-04-10 (×4): 50 meq via INTRAVENOUS

## 2017-04-10 MED ORDER — SODIUM CHLORIDE 0.9 % IV SOLN
INTRAVENOUS | Status: DC | PRN
  Administered 2017-04-10 (×2): 1000 mL via INTRAVENOUS

## 2017-04-10 MED ORDER — CALCIUM CHLORIDE 10 % IV SOLN
INTRAVENOUS | Status: DC | PRN
  Administered 2017-04-10: 1 g via INTRAVENOUS

## 2017-04-10 MED ORDER — MAGNESIUM SULFATE 50 % IJ SOLN
INTRAMUSCULAR | Status: DC | PRN
  Administered 2017-04-10: 2 g via INTRAVENOUS

## 2017-04-12 MED FILL — Medication: Qty: 1 | Status: AC
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
## 2017-05-03 NOTE — Code Documentation (Signed)
Patient time of death occurred at 151am

## 2017-05-03 NOTE — ED Provider Notes (Addendum)
[facility]-EMERGENCY DEPT Provider Note   CSN: [id number] Arrival date & time: 2017-04-26  0119     History   Chief Complaint Chief Complaint  Patient presents with  . Cardiac Arrest    HPI Francisco Jacobs is a 36 y.o. male.  HPI LEVEL 5 CAVEAT FOR CARDIAC ARREST. Paramedics bring patient to the ER in cardiac arrest. According to paramedics patient was found to be running around the streets [place] and police were called to apprehend him. Police had to physically restrain the patients. The police noted that pt was hallucinating and thus they called paramedics. As soon as patient got out of the police car he arrested. CPR was started immediately. Pt arrived to the ER intubated and post 4 rounds of epi and 20 minutes of CPR.  ACLS continued in the ER. We had ROSC after 1 round of epi and bicarb. While awaiting CXR patient arrested again (within 2 minutes of ROSC).  CPR started again and after several rounds of CPR patient was pronounced deceased at 1:51 am.  Of note, EMS alleges that patient has hx of molly use and cocaine use - and our system shows past hx of cocaine use.  Past Medical History:  Diagnosis Date  . Ankle fracture   . Asthma    as a child only  . ETOH abuse   . Migraine    "don't remember the last time I had one" (05/11/2014)    Patient Active Problem List   Diagnosis Date Noted  . Paranoid schizophrenia (HCC) 03/30/2016  . Cocaine use disorder, moderate, dependence (HCC) 03/30/2016  . Alcohol use disorder, moderate, dependence (HCC) 03/30/2016  . Tooth ache 03/30/2016  . 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 NO PAST SURGERIES    . ORIF FIBULA FRACTURE Left 09/19/2014   Procedure: OPEN REDUCTION INTERNAL FIXATION (ORIF) FIBULA FRACTURE, Open Reduction Internal Fixation Syndesmosis;  Surgeon: Eldred Manges, MD;  Location: [facility] OR;  Service: Orthopedics;  Laterality: Left;        Home Medications    Prior to Admission medications   Medication Sig Start Date End Date Taking? Authorizing Provider  folic acid (FOLVITE) 1 MG tablet Take 1 tablet (1 mg total) by mouth daily. 01/21/17   Noralee Stain, DO  thiamine 100 MG tablet Take 1 tablet (100 mg total) by mouth daily. 01/21/17   Noralee Stain, DO    Family History Family History  Problem Relation Age of Onset  . Bipolar disorder Other   . Schizophrenia Other     Social History Social History   Tobacco Use  . Smoking status: Current Every Day Smoker    Packs/day: 0.50    Years: 20.00    Pack years: 10.00    Types: Cigarettes  . Smokeless tobacco: Never Used  Substance Use Topics  . Alcohol use: Yes    Alcohol/week: 63.0 oz    Types: 105 Cans of beer per week    Comment: daily 6 beers  . Drug use: Yes    Frequency: 7.0 times per week    Types: Cocaine, Marijuana     Allergies   Penicillins   Review of Systems Review of Systems  Unable to perform ROS: Patient unresponsive     Physical Exam Updated Vital Signs BP (!) 90/51   Pulse (!) 29   Resp 17   Wt 70.3 kg (155 lb)   SpO2 (!) 87%  BMI 21.62 kg/m   Physical Exam  Constitutional: He appears well-developed.  Patient has dirt all over his body  HENT:  Head: Atraumatic.  No gross signs of trauma to the head  Eyes:  4 mm and equal, non reactive to light  Cardiovascular:  pulseless  Pulmonary/Chest:  intubated  Abdominal: Soft.  Musculoskeletal: He exhibits no deformity.  Neurological:  GCS - 3  Skin: Skin is warm.  Nursing note and vitals reviewed.    ED Treatments / Results  Labs (all labs ordered are listed, but only abnormal results are displayed) Labs Reviewed  CBC WITH DIFFERENTIAL/PLATELET - Abnormal; Notable for the following:       Result Value   MCV 101.9 (*)    All other components within normal limits  I-STAT CG4 LACTIC ACID, ED - Abnormal; Notable for the following:    Lactic Acid, Venous  >17.00 (*)    All other components within normal limits  I-STAT CHEM 8, ED - Abnormal; Notable for the following:    Sodium 147 (*)    Chloride 114 (*)    Glucose, Bld 139 (*)    Calcium, Ion 1.06 (*)    TCO2 11 (*)    All other components within normal limits  APTT  ETHANOL  RAPID URINE DRUG SCREEN, HOSP PERFORMED  MAGNESIUM  PHOSPHORUS  I-STAT TROPONIN, ED  I-STAT ARTERIAL BLOOD GAS, ED    EKG  EKG Interpretation  Date/Time:  Saturday Apr 20, 2017 01:22:26 EDT Ventricular Rate:  69 PR Interval:    QRS Duration: 136 QT Interval:  412 QTC Calculation: 442 R Axis:   -75 Text Interpretation:  with undetermined rhythm irregularity RBBB and LAFB wide complex rhythm, irregular possiblly juctional escape or afib with aberrancy many changes noted compared to last ekg Confirmed by Derwood Kaplan [phone]) on 04/20/17 2:08:27 AM       Radiology No results found.  Procedures Procedures (including critical care time)  Cardiopulmonary Resuscitation (CPR) Procedure Note Directed/Performed by: Derwood Kaplan I personally directed ancillary staff and/or performed CPR in an effort to regain return of spontaneous circulation and to maintain cardiac, neuro and systemic perfusion.    Medications Ordered in ED Medications  EPINEPHrine (ADRENALIN) 1 MG/10ML injection (1 mg Intravenous Given 2017/04/20 0146)  sodium bicarbonate injection (50 mEq Intravenous Given 20-Apr-2017 0149)  0.9 %  sodium chloride infusion (1,000 mLs Intravenous New Bag/Given April 20, 2017 0134)  calcium chloride injection (1 g Intravenous Given 20-Apr-2017 0141)  magnesium sulfate (IV Push/IM) injection (2 g Intravenous Given 04-20-17 0143)  etomidate (AMIDATE) injection (10 mg Intravenous Given 04/20/17 0147)  EPINEPHrine (ADRENALIN) 1 MG/10ML injection (1 mg Intravenous Given 04-20-2017 0120)  sodium bicarbonate injection (50 mEq Intravenous Given 04-20-2017 0120)     Initial Impression / Assessment and Plan / ED Course  I have  reviewed the triage vital signs and the nursing notes.  Pertinent labs & imaging results that were available during my care of the patient were reviewed by me and considered in my medical decision making (see chart for details).     Pt with hx of bipolar, schizophrenia and polysubstance abuse comes in after cardiac arrest. We had a very brief ROSC in the ER. Pt received multiple rounds of epi, bicarb - but we never had ROSC after he arrested the 2nd time. Pt was given mag, calcium, and even etomidate - the latter at the very end to see if it can blunt any sympathomimetic activity that might be contributing. Pt received at  least 1 liter iv bolus as well and the O2 sats were > 85% while being bagged with condensation and color change on capnography.  Pt might have arrested due to excited delerium. We spoke with medical examiner, who will accept the patient. Time of death was 1:50 am.  Final Clinical Impressions(s) / ED Diagnoses   Final diagnoses:  Cardiac arrest (HCC)  Lactic acidosis    New Prescriptions New Prescriptions   No medications on file     Derwood KaplanNanavati, Velora Horstman, MD 2017-01-01 [id number]    Derwood KaplanNanavati, Eathon Valade, MD 08/19/17 [phone]

## 2017-05-03 NOTE — ED Notes (Signed)
Provided next of kin information to [employer], who plan to make notification.

## 2017-05-03 NOTE — Progress Notes (Signed)
Patient arrived via ems, cpr in progress, intubated in the field with 7.5 ett secured at 24.  Placement confirmed via bbs, condensation in tube, and capnography through the zoll.  ROSC was achieved briefly, during which time patient was placed on the vent 600/16/100%/5+.

## 2017-05-03 NOTE — ED Provider Notes (Signed)
I called [phone] and [phone] - no one answered on either of those lines.    Derwood KaplanNanavati, Sita Mangen, MD 04-09-2017 [phone]

## 2017-05-03 DEATH — deceased

## 2019-03-11 IMAGING — DX DG CHEST 1V PORT
1 series · 1 of 1 positions shown · non-contrast
Comparison: 01/16/2017

CLINICAL DATA: Seen and admitted from ED 1 day ago - patient with
fever and tachycardia, with possible pneumonia on x-ray - follow up
PNA - current smoker - hx asthma

EXAM:
PORTABLE CHEST 1 VIEW

[chest ap]
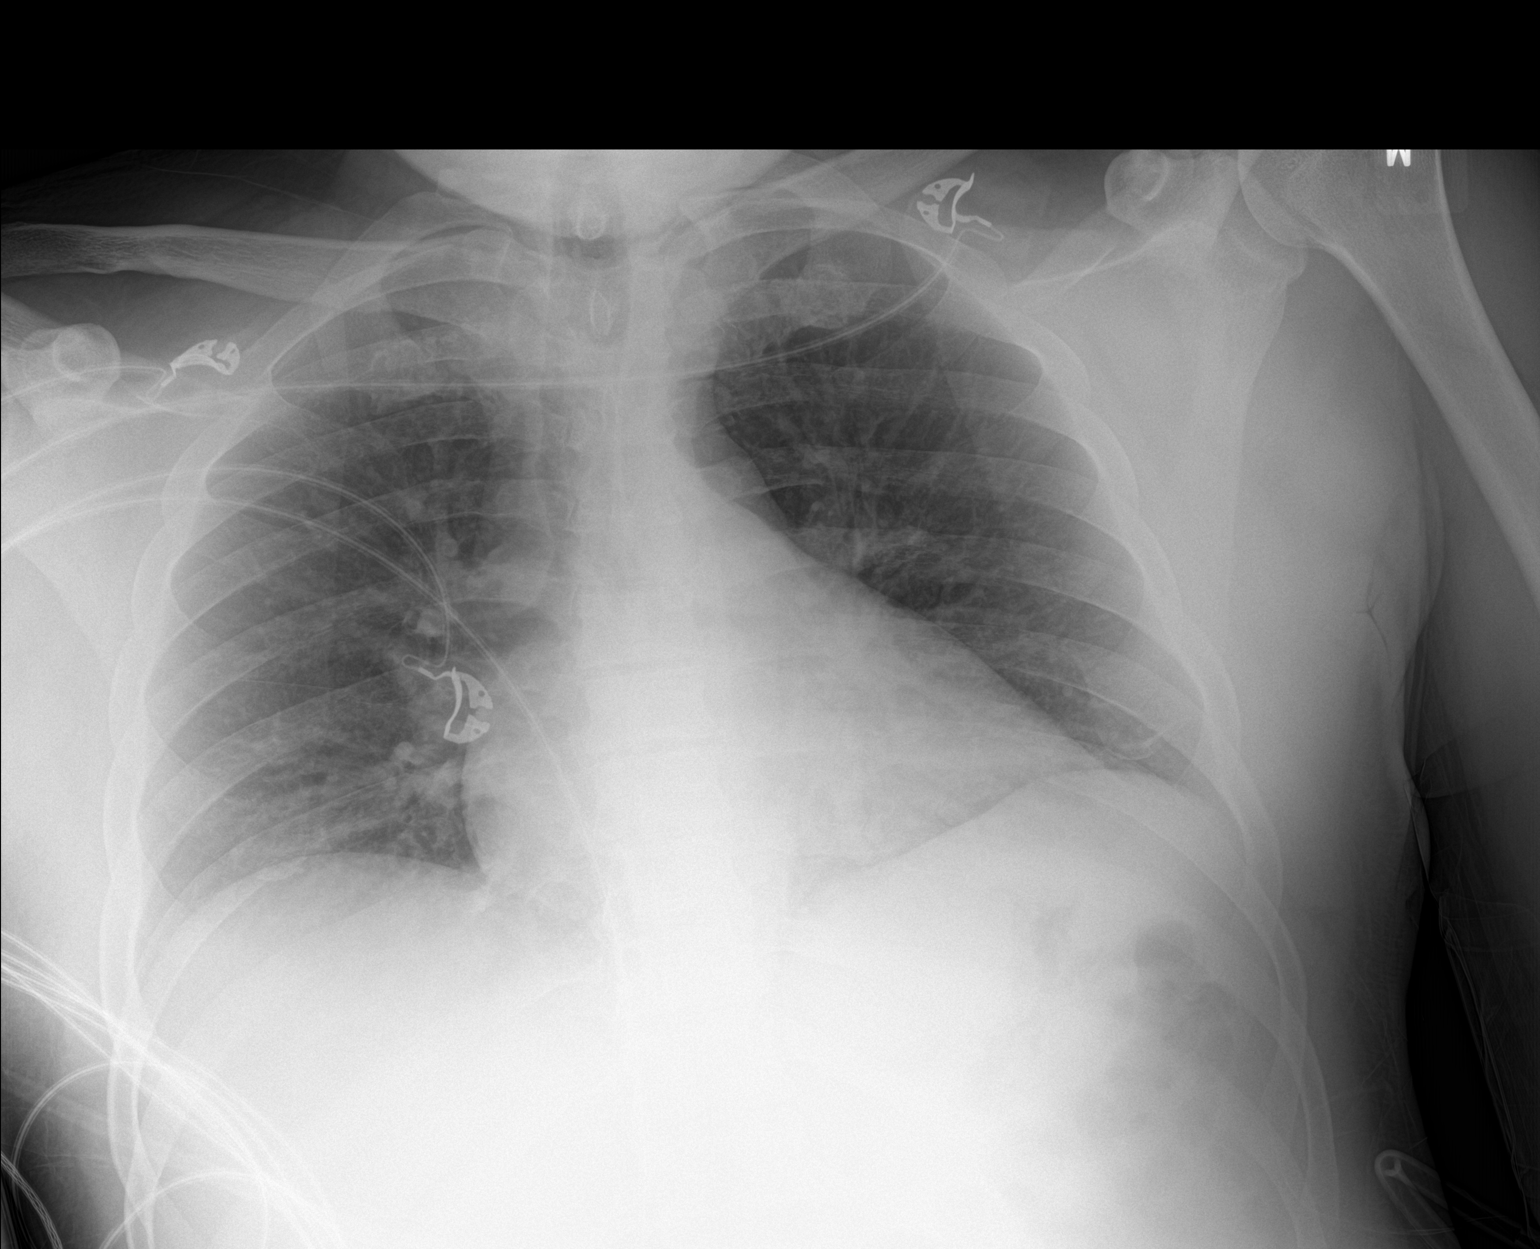

[1 of 1 positions shown; findings below may reference images not displayed]

FINDINGS: The heart size and mediastinal contours are within normal limits.
Both lungs are clear. The visualized skeletal structures are
unremarkable. Shallow lung inflation.
IMPRESSION: No active disease.
